# Patient Record
Sex: Male | Born: 1955 | Race: White | Hispanic: No | Marital: Married | Smoking: Former smoker
Health system: Southern US, Community
[De-identification: ages and names within clinical notes are randomized; demographics above are authoritative.]

## PROBLEM LIST (undated history)

## (undated) DIAGNOSIS — N200 Calculus of kidney: Secondary | ICD-10-CM

## (undated) DIAGNOSIS — F411 Generalized anxiety disorder: Secondary | ICD-10-CM

## (undated) DIAGNOSIS — E782 Mixed hyperlipidemia: Secondary | ICD-10-CM

## (undated) DIAGNOSIS — K5730 Diverticulosis of large intestine without perforation or abscess without bleeding: Secondary | ICD-10-CM

## (undated) DIAGNOSIS — N139 Obstructive and reflux uropathy, unspecified: Secondary | ICD-10-CM

## (undated) DIAGNOSIS — M5412 Radiculopathy, cervical region: Secondary | ICD-10-CM

## (undated) DIAGNOSIS — M5030 Other cervical disc degeneration, unspecified cervical region: Secondary | ICD-10-CM

## (undated) DIAGNOSIS — I10 Essential (primary) hypertension: Secondary | ICD-10-CM

## (undated) DIAGNOSIS — G4700 Insomnia, unspecified: Secondary | ICD-10-CM

## (undated) HISTORY — PX: HEMORROIDECTOMY: SUR656

## (undated) HISTORY — DX: Obstructive and reflux uropathy, unspecified: N13.9

## (undated) HISTORY — DX: Insomnia, unspecified: G47.00

## (undated) HISTORY — DX: Radiculopathy, cervical region: M54.12

## (undated) HISTORY — DX: Calculus of kidney: N20.0

## (undated) HISTORY — DX: Generalized anxiety disorder: F41.1

## (undated) HISTORY — DX: Mixed hyperlipidemia: E78.2

## (undated) HISTORY — PX: TONSILLECTOMY: SHX5217

## (undated) HISTORY — DX: Diverticulosis of large intestine without perforation or abscess without bleeding: K57.30

## (undated) HISTORY — DX: Essential (primary) hypertension: I10

## (undated) HISTORY — DX: Other cervical disc degeneration, unspecified cervical region: M50.30

---

## 2007-05-12 ENCOUNTER — Ambulatory Visit: Payer: Self-pay | Admitting: Emergency Medicine

## 2009-04-22 ENCOUNTER — Ambulatory Visit: Payer: Self-pay | Admitting: Family Medicine

## 2010-11-18 ENCOUNTER — Ambulatory Visit: Payer: Self-pay | Admitting: Internal Medicine

## 2011-09-17 ENCOUNTER — Emergency Department: Payer: Self-pay | Admitting: Unknown Physician Specialty

## 2011-12-17 ENCOUNTER — Ambulatory Visit: Payer: Self-pay | Admitting: Family Medicine

## 2011-12-22 ENCOUNTER — Ambulatory Visit: Payer: Self-pay | Admitting: Family Medicine

## 2011-12-22 DIAGNOSIS — R0602 Shortness of breath: Secondary | ICD-10-CM

## 2011-12-22 DIAGNOSIS — I517 Cardiomegaly: Secondary | ICD-10-CM

## 2012-03-21 ENCOUNTER — Ambulatory Visit: Payer: Self-pay | Admitting: Emergency Medicine

## 2012-05-08 ENCOUNTER — Ambulatory Visit: Payer: Self-pay | Admitting: Medical

## 2012-11-09 ENCOUNTER — Emergency Department: Payer: Self-pay | Admitting: Emergency Medicine

## 2012-11-09 ENCOUNTER — Telehealth: Payer: Self-pay

## 2012-11-09 LAB — CBC
HGB: 17.8 g/dL (ref 13.0–18.0)
MCV: 92 fL (ref 80–100)
Platelet: 256 10*3/uL (ref 150–440)
RDW: 14 % (ref 11.5–14.5)

## 2012-11-09 LAB — COMPREHENSIVE METABOLIC PANEL
Alkaline Phosphatase: 90 U/L (ref 50–136)
Anion Gap: 5 — ABNORMAL LOW (ref 7–16)
BUN: 18 mg/dL (ref 7–18)
Bilirubin,Total: 0.8 mg/dL (ref 0.2–1.0)
Co2: 30 mmol/L (ref 21–32)
Creatinine: 0.69 mg/dL (ref 0.60–1.30)
Osmolality: 283 (ref 275–301)
Potassium: 5.3 mmol/L — ABNORMAL HIGH (ref 3.5–5.1)
SGOT(AST): 30 U/L (ref 15–37)
SGPT (ALT): 41 U/L (ref 12–78)
Sodium: 141 mmol/L (ref 136–145)

## 2012-11-09 LAB — TROPONIN I: Troponin-I: 0.04 ng/mL

## 2012-11-09 NOTE — Telephone Encounter (Signed)
Dr. Mariah Milling spoke with PA in [facility] ED "schedule pt f/u from with Dr. Kirke Corin or myself for CP" VO Dr. Alvis Lemmings, RN

## 2012-11-22 ENCOUNTER — Telehealth: Payer: Self-pay

## 2012-11-22 NOTE — Telephone Encounter (Signed)
Lm for pt to call office but has not called as of today

## 2012-11-22 NOTE — Telephone Encounter (Signed)
Did we schedule pt?

## 2012-11-25 NOTE — Telephone Encounter (Signed)
Call pt number again to try and schedule and he says he doesn't want to schedule with [place].

## 2012-11-29 DIAGNOSIS — I10 Essential (primary) hypertension: Secondary | ICD-10-CM

## 2012-11-29 HISTORY — DX: Essential (primary) hypertension: I10

## 2012-11-30 DIAGNOSIS — F411 Generalized anxiety disorder: Secondary | ICD-10-CM

## 2012-11-30 DIAGNOSIS — E782 Mixed hyperlipidemia: Secondary | ICD-10-CM

## 2012-11-30 DIAGNOSIS — K644 Residual hemorrhoidal skin tags: Secondary | ICD-10-CM | POA: Insufficient documentation

## 2012-11-30 DIAGNOSIS — N139 Obstructive and reflux uropathy, unspecified: Secondary | ICD-10-CM

## 2012-11-30 DIAGNOSIS — K5730 Diverticulosis of large intestine without perforation or abscess without bleeding: Secondary | ICD-10-CM

## 2012-11-30 HISTORY — DX: Mixed hyperlipidemia: E78.2

## 2012-11-30 HISTORY — DX: Generalized anxiety disorder: F41.1

## 2012-11-30 HISTORY — DX: Diverticulosis of large intestine without perforation or abscess without bleeding: K57.30

## 2012-11-30 HISTORY — DX: Obstructive and reflux uropathy, unspecified: N13.9

## 2012-12-30 DIAGNOSIS — G4700 Insomnia, unspecified: Secondary | ICD-10-CM

## 2012-12-30 HISTORY — DX: Insomnia, unspecified: G47.00

## 2013-10-02 IMAGING — CT CT CERVICAL SPINE WITHOUT CONTRAST
1 series · 12 of 14 positions shown, 15 images · non-contrast
Comparison: None

REASON FOR EXAM: fall neck pain
COMMENTS:

PROCEDURE:     CT  - CT CERVICAL SPINE WO  - September 17, 2011  [DATE]
RESULT:     Clinical Indication: Trauma
TECHNIQUE: Multiple axial CT images from the skull base to the mid vertebral
body of T1. obtained with sagittal and coronal reformatted images provided.

[Series 4: axial · axial · 0.33mm/px · z∈[-241,-88]mm · 12 of 96 slices shown, 15 images]
[im 8/96  soft-tissue]
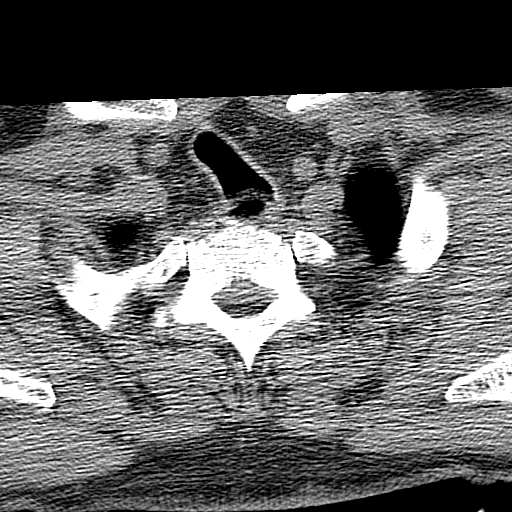
[im 8/96  bone]
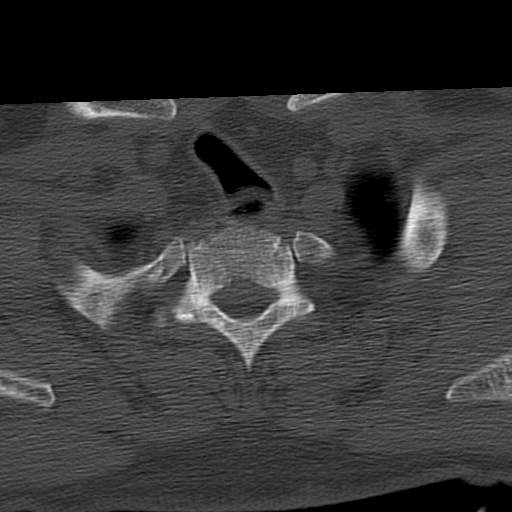
[im 15/96  bone]
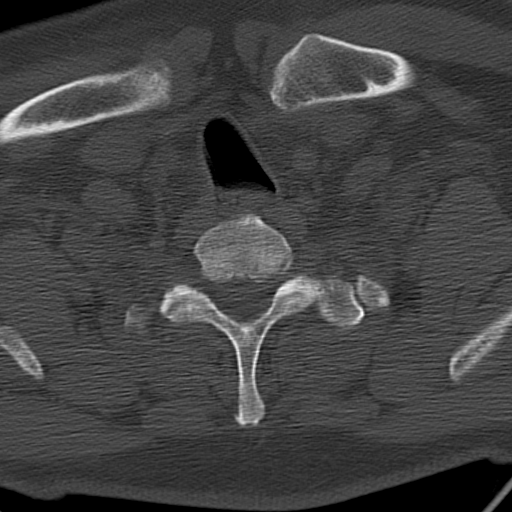
[im 22/96  bone]
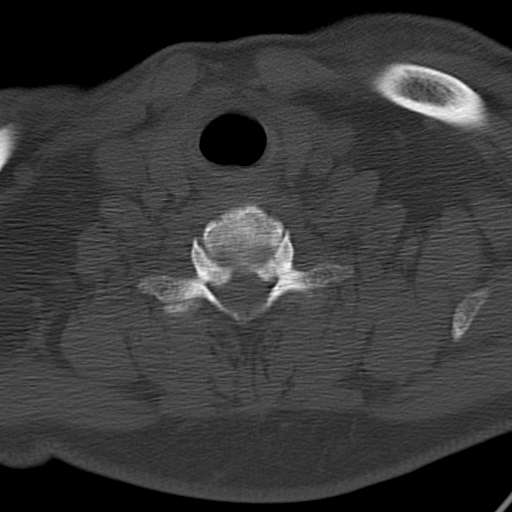
[im 30/96  bone]
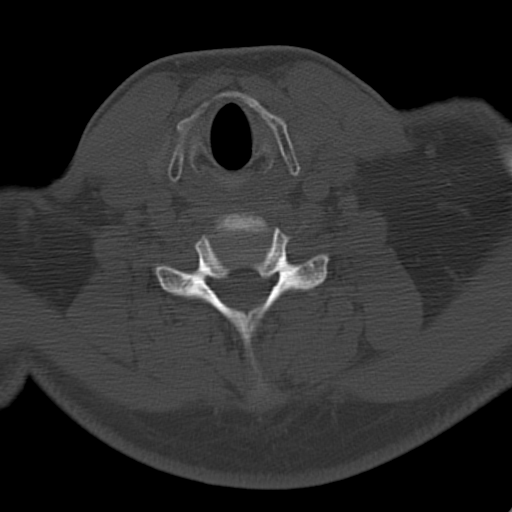
[im 37/96  soft-tissue]
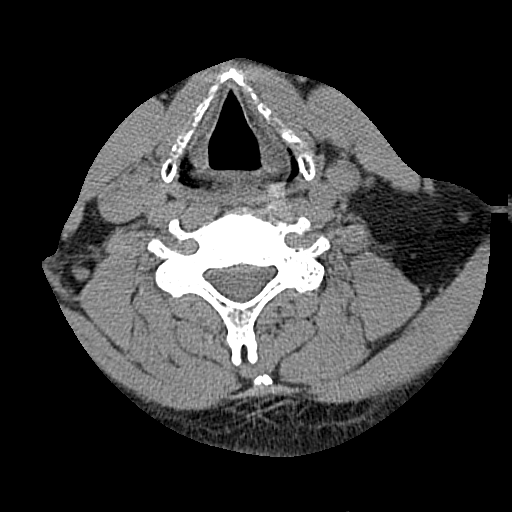
[im 37/96  bone]
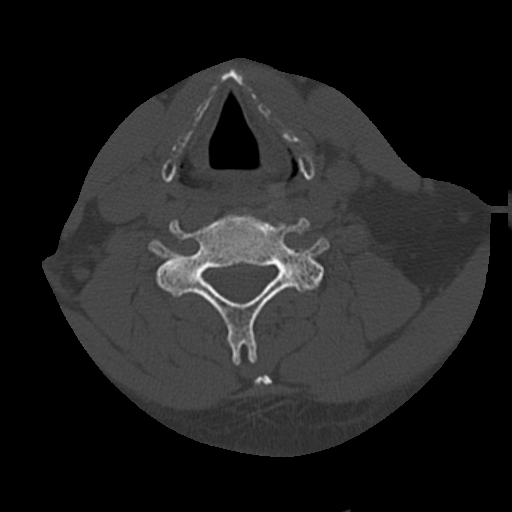
[im 44/96  bone]
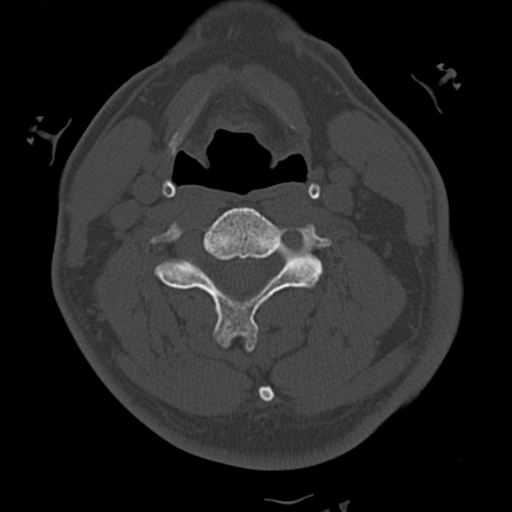
[im 52/96  bone]
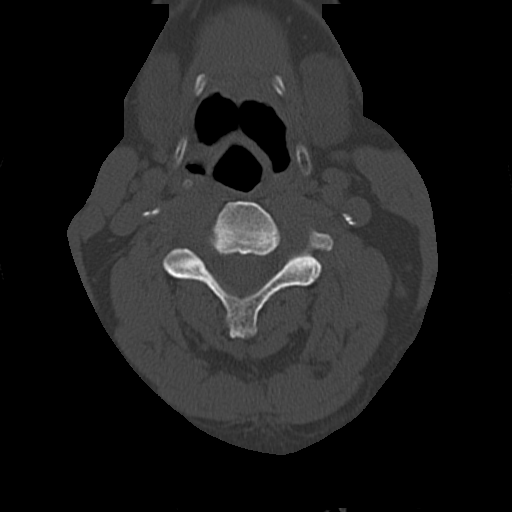
[im 59/96  bone]
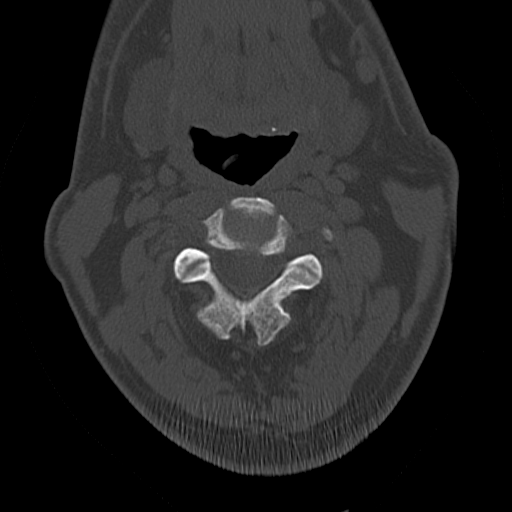
[im 66/96  soft-tissue]
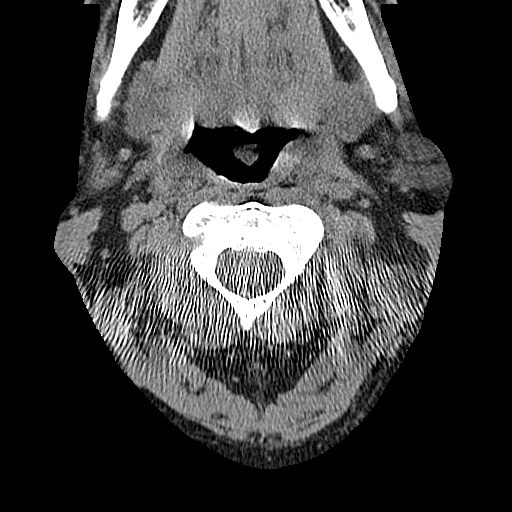
[im 66/96  bone]
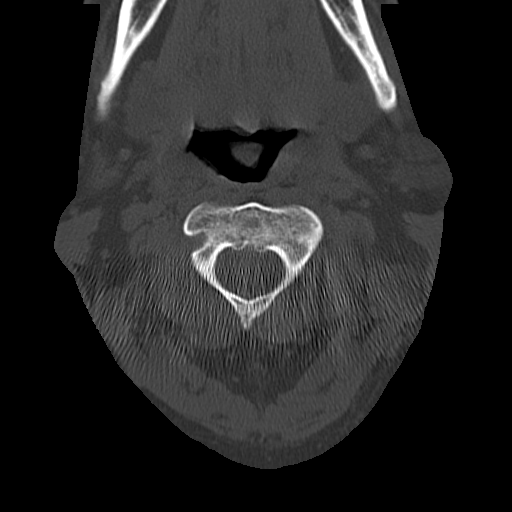
[im 74/96  bone]
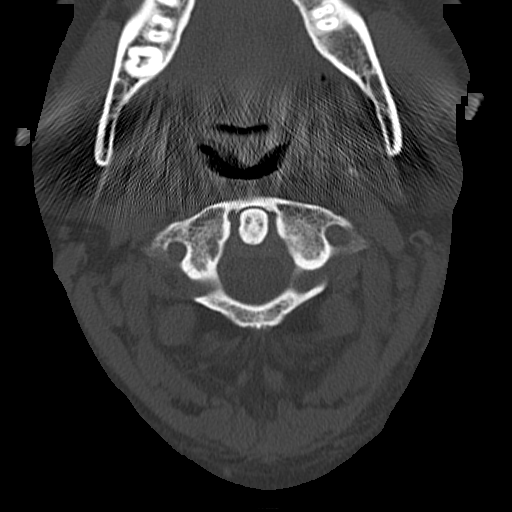
[im 81/96  bone]
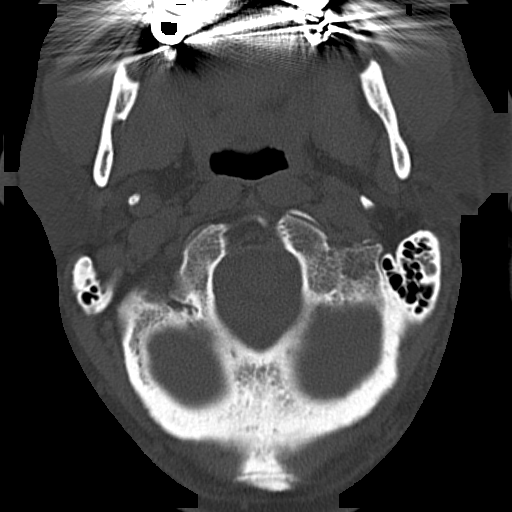
[im 88/96  bone]
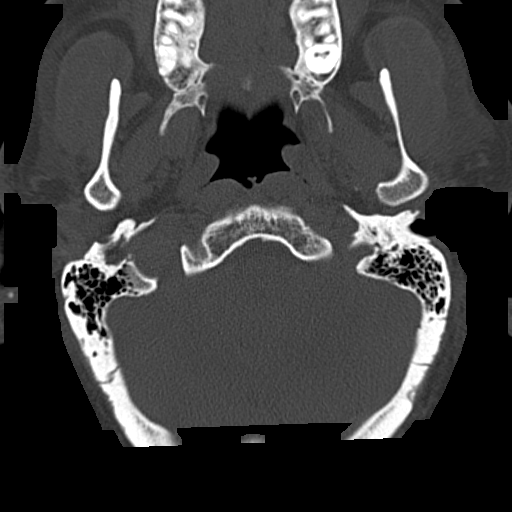

[12 of 14 positions shown; findings below may reference images not displayed]

FINDINGS: The alignment is anatomic. The vertebral body heights are maintained. There
is no acute fracture or static listhesis. The prevertebral soft tissues are
normal. The intraspinal soft tissues are not fully imaged on this
examination due to poor soft tissue contrast, but there is no soft tissue
gross abnormality.

There is degenerative disease at C6-C7.

The visualized portions of the lung apices demonstrate no focal abnormality.

There is bilateral carotid artery atherosclerosis.
IMPRESSION: 1. No acute osseous injury of the cervical spine.

2. Ligamentous injury is not evaluated. If there is high clinical concern
for ligamentous injury, consider MRI or flexion/extension radiographs as
clinically indicated and tolerated.

## 2014-01-01 IMAGING — US US CAROTID DUPLEX BILAT
1 series · 17 of 24 positions shown · non-contrast
Comparison: none

REASON FOR EXAM: High blood pressure hyperlipidemia arthrosclerosis
carotid artery seen on CT
COMMENTS:

[Series 1: us carotid duplex bilat · 17 of 69 slices shown]
[im 1/69]
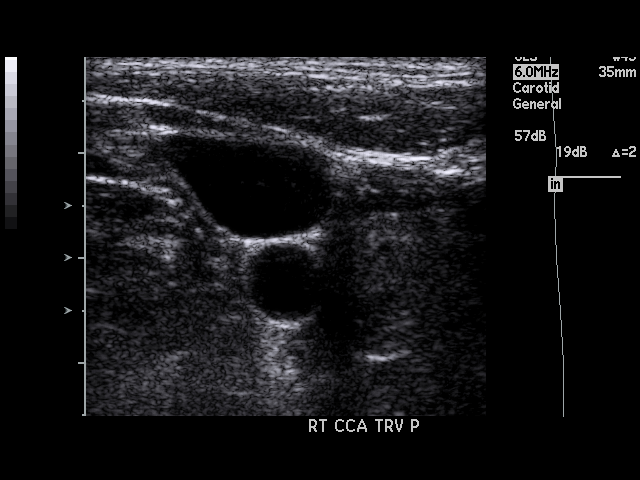
[im 6/69]
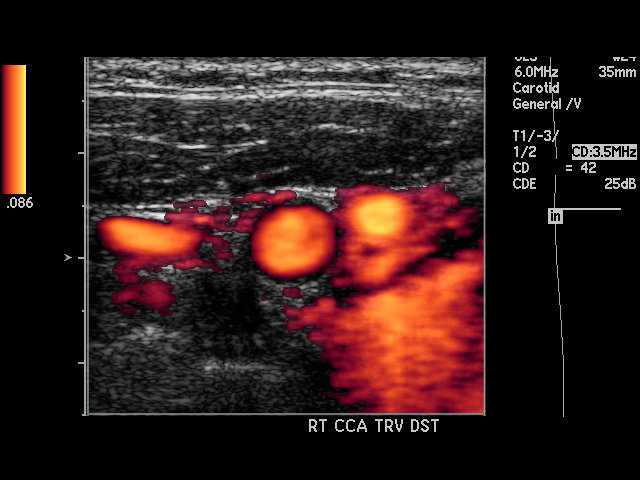
[im 9/69]
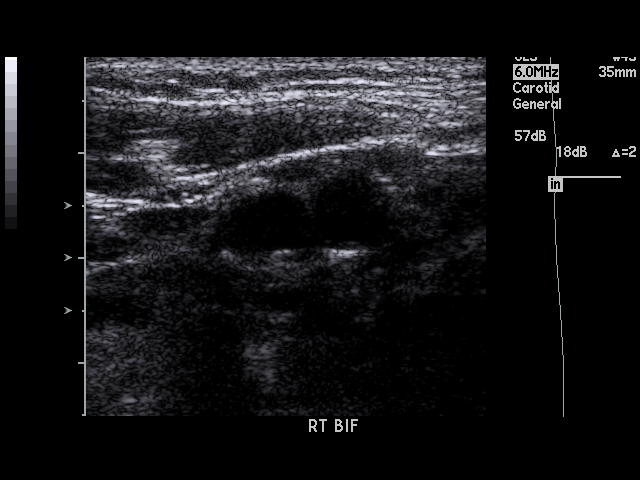
[im 12/69]
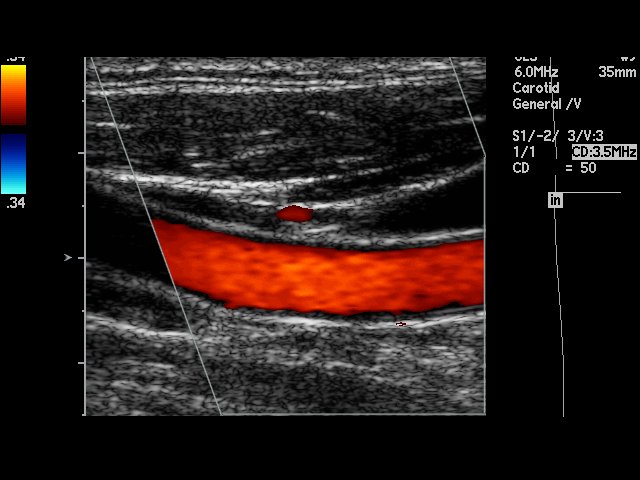
[im 18/69]
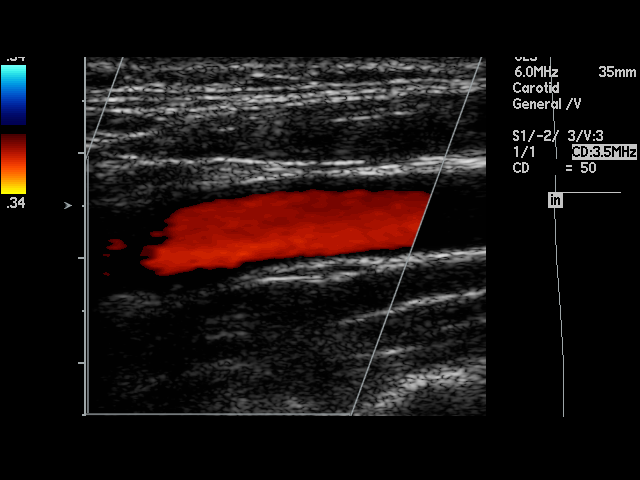
[im 21/69]
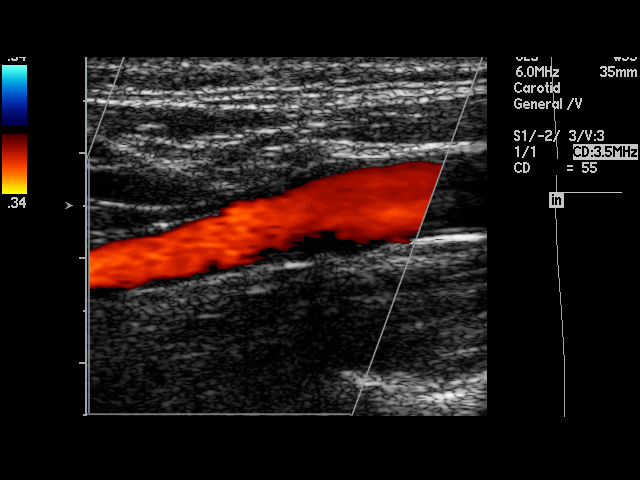
[im 27/69]
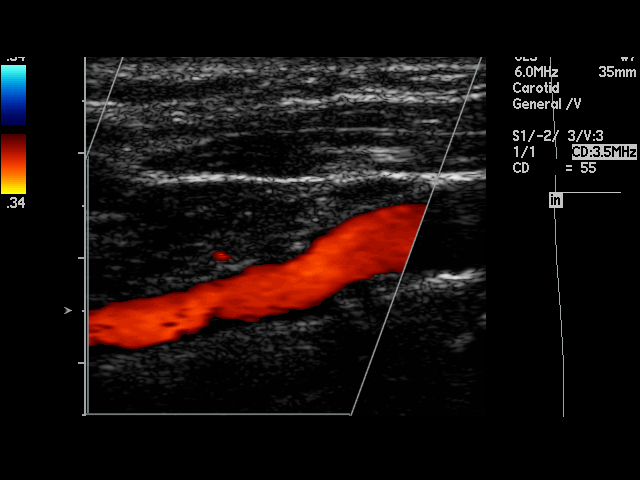
[im 30/69]
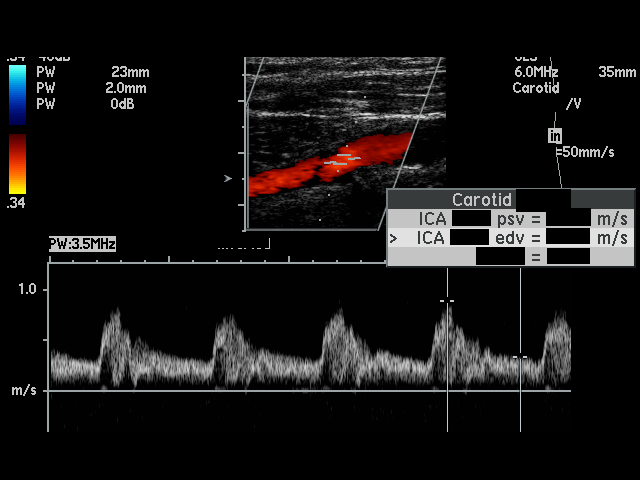
[im 36/69]
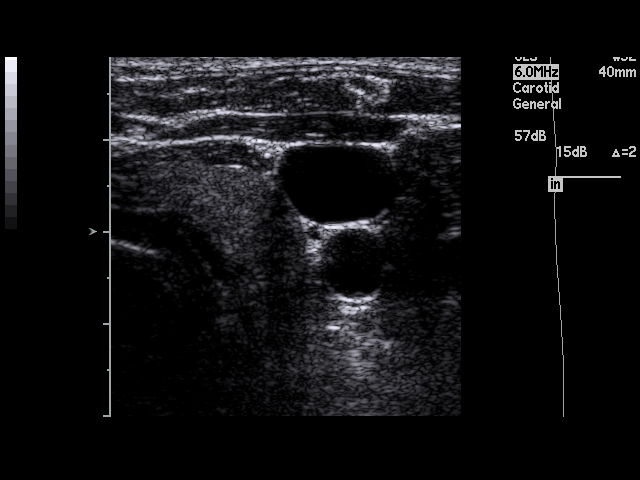
[im 39/69]
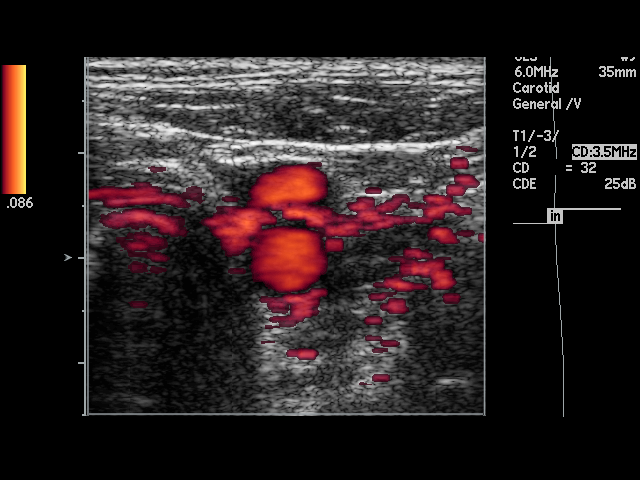
[im 42/69]
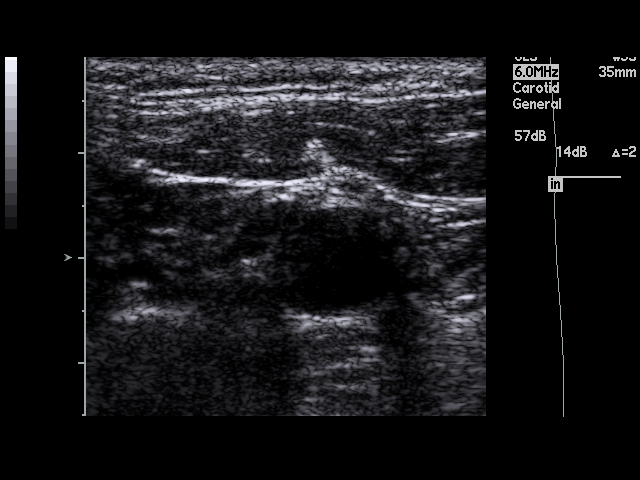
[im 48/69]
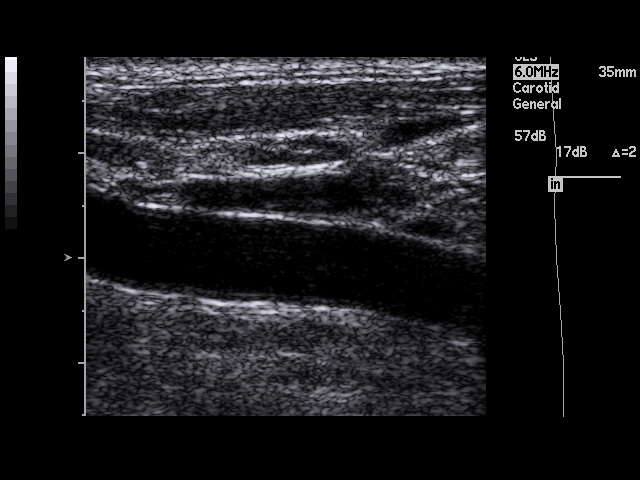
[im 51/69]
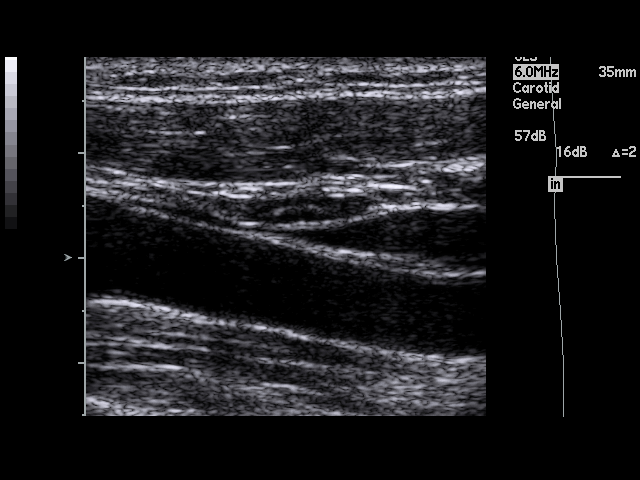
[im 57/69]
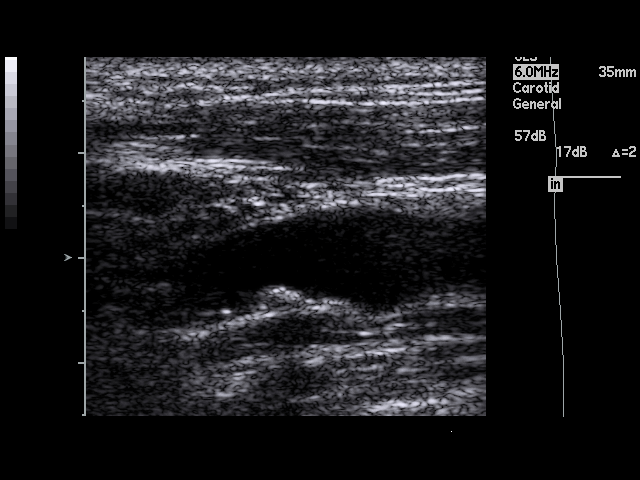
[im 60/69]
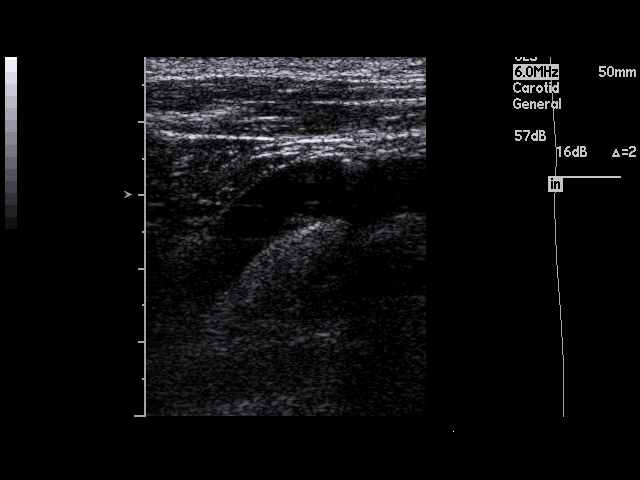
[im 63/69]
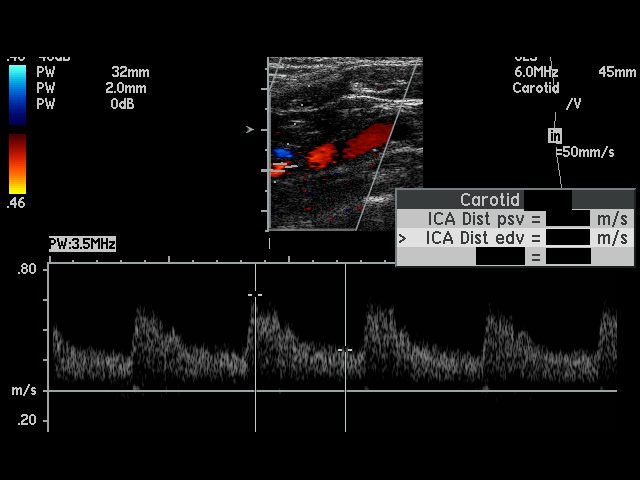
[im 69/69]
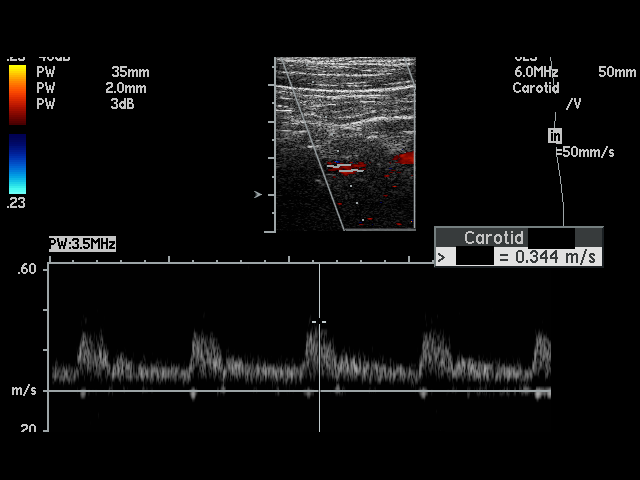

[17 of 24 positions shown; findings below may reference images not displayed]

PROCEDURE:     FINALHO - FINALHO CAROTID DOPPLER BILATERAL  - December 17, 2011 [DATE]

RESULT:     Carotid Doppler interrogation is performed. Antegrade flow is
seen in the carotid and vertebral systems bilaterally without visual
evidence of significant stenosis. There is some nonulcerated, smooth plaque
in the carotid bifurcation and internal carotid regions without abnormal
flow velocity. Antegrade flow is seen in both vertebral systems. The peak
systolic velocities are normal. The internal to common carotid peak systolic
velocity ratio is 1.10 on the right and 0.96 on the left.
IMPRESSION: 1.     Minimal atherosclerotic disease.
2.     No evidence of hemodynamically significant stenosis or flow reversal.

[REDACTED]

## 2014-06-13 DIAGNOSIS — M5030 Other cervical disc degeneration, unspecified cervical region: Secondary | ICD-10-CM

## 2014-06-13 DIAGNOSIS — M5412 Radiculopathy, cervical region: Secondary | ICD-10-CM | POA: Insufficient documentation

## 2014-06-13 HISTORY — DX: Radiculopathy, cervical region: M54.12

## 2014-06-13 HISTORY — DX: Other cervical disc degeneration, unspecified cervical region: M50.30

## 2016-06-19 ENCOUNTER — Other Ambulatory Visit: Payer: Self-pay

## 2016-06-19 ENCOUNTER — Ambulatory Visit (INDEPENDENT_AMBULATORY_CARE_PROVIDER_SITE_OTHER): Payer: BLUE CROSS/BLUE SHIELD | Admitting: Gastroenterology

## 2016-06-19 ENCOUNTER — Encounter: Payer: Self-pay | Admitting: Gastroenterology

## 2016-06-19 VITALS — BP 105/69 | HR 77 | Temp 98.2°F | Ht 68.0 in | Wt 203.0 lb

## 2016-06-19 DIAGNOSIS — K921 Melena: Secondary | ICD-10-CM

## 2016-06-19 DIAGNOSIS — R194 Change in bowel habit: Secondary | ICD-10-CM

## 2016-06-19 NOTE — Progress Notes (Signed)
Gastroenterology Consultation  Referring Provider:     No ref. provider found Primary Care Physician:  [facility] Madaline GuthrieALE E, MD Primary Gastroenterologist:  Dr. Servando SnareWohl     Reason for Consultation:     Hematochezia        HPI:   Jason Vazquez is a 60 y.o. y/o male referred for consultation & management of Hematochezia by Dr. Maryjane HurterFELDPAUSCH, Madaline GuthrieALE E, MD.  This patient comes today with a history of rectal bleeding. The patient also reports that his father had colon cancer. The patient has had 3 attempted colonoscopies and states that nobody has been able to transverse his colon with a colonoscope. The patient reports that his stools have become different in shape and smaller. He also reports that he has intermittent rectal bleeding despite having surgery for hemorrhoids in the past. There is no report of any unexplained weight loss, fevers, chills, nausea or vomiting. The patient also denies any black stools associated with his rectal bleeding. The patient states that her rectal bleeding is usually bright red blood. He also states that he has had 2 episodes of pancreatitis in the past but not in the last couple of years. Her being asked to see the patient because of his family history of colon cancer, change in bowel habits and inability to have a colonoscopy in the past.  Past Medical History:  Diagnosis Date  . Cervical radiculitis 06/13/2014  . DDD (degenerative disc disease), cervical 06/13/2014  . Diverticulosis of large intestine 11/30/2012  . Essential (primary) hypertension 11/29/2012   Overview:  Last Assessment & Plan:  His blood pressure is doing much better and it is adequately controlled.  He is not experiencing any side effects. For now continue metoprolol and losartan. I suggest that he continue to monitor his blood pressure home, but less frequently. Plan routine follow-up in 6 months.  . Generalized anxiety disorder 11/30/2012  . Insomnia 12/30/2012   Overview:  Last Assessment & Plan:  For  now continue clonazepam as needed 0.5 mg at bedtime. He may take an additional 0.5 mg during the day for acute anxiety  . Mixed hyperlipidemia 11/30/2012  . Obstructive and reflux uropathy 11/30/2012    Past Surgical History:  Procedure Laterality Date  . HEMORROIDECTOMY    . TONSILLECTOMY      Prior to Admission medications   Medication Sig Start Date End Date Taking? Authorizing Provider  B Complex Vitamins (VITAMIN-B COMPLEX) TABS Take by mouth.   Yes Historical Provider, MD  Cholecalciferol (VITAMIN D3) 2000 units capsule Take by mouth.   Yes Historical Provider, MD  clonazePAM (KLONOPIN) 0.5 MG tablet TAKE 1-3 TABS AT BEDTIME FOR INSOMNIA 06/27/13  Yes Historical Provider, MD  FLUoxetine (PROZAC) 10 MG capsule  06/17/16  Yes Historical Provider, MD  gabapentin (NEURONTIN) 300 MG capsule TAKE ONE CAPSULE BY MOUTH THREE TIMES DAILY 10/23/15  Yes Historical Provider, MD  losartan (COZAAR) 100 MG tablet  05/26/16  Yes Historical Provider, MD  metoprolol succinate (TOPROL-XL) 100 MG 24 hr tablet  05/23/16  Yes Historical Provider, MD  Multiple Vitamin (MULTIVITAMIN) capsule Take by mouth.   Yes Historical Provider, MD  Multiple Vitamins-Minerals (MULTIVITAMIN ADULT PO) Take by mouth.   Yes Historical Provider, MD  pravastatin (PRAVACHOL) 80 MG tablet  05/23/16  Yes Historical Provider, MD  tamsulosin (FLOMAX) 0.4 MG CAPS capsule  05/23/16  Yes Historical Provider, MD  aspirin EC 81 MG tablet Take 81 mg by mouth.    Historical Provider, MD  atenolol (  TENORMIN) 50 MG tablet Take 50 mg by mouth. 07/28/11   Historical Provider, MD  Cyanocobalamin (RA VITAMIN B-12 TR) 1000 MCG TBCR Take by mouth.    Historical Provider, MD  Glucosamine Sulfate 500 MG TABS Take by mouth.    Historical Provider, MD  Saw Palmetto 450 MG CAPS Take 900 mg by mouth.    Historical Provider, MD  TURMERIC PO Take by mouth.    Historical Provider, MD    Family History  Problem Relation Age of Onset  . COPD Mother   .  COPD Father      Social History  Substance Use Topics  . Smoking status: Never Smoker  . Smokeless tobacco: Never Used  . Alcohol use No    Allergies as of 06/19/2016  . (No Known Allergies)    Review of Systems:    All systems reviewed and negative except where noted in HPI.   Physical Exam:  BP 105/69   Pulse 77   Temp 98.2 F (36.8 C) (Oral)   Ht 5\' 8"  (1.727 m)   Wt 203 lb (92.1 kg)   BMI 30.87 kg/m  No LMP for male patient. Psych:  Alert and cooperative. Normal mood and affect. General:   Alert,  Well-developed, well-nourished, pleasant and cooperative in NAD Head:  Normocephalic and atraumatic. Eyes:  Sclera clear, no icterus.   Conjunctiva pink. Ears:  Normal auditory acuity. Nose:  No deformity, discharge, or lesions. Mouth:  No deformity or lesions,oropharynx pink & moist. Neck:  Supple; no masses or thyromegaly. Lungs:  Respirations even and unlabored.  Clear throughout to auscultation.   No wheezes, crackles, or rhonchi. No acute distress. Heart:  Regular rate and rhythm; no murmurs, clicks, rubs, or gallops. Abdomen:  Normal bowel sounds.  No bruits.  Soft, non-tender and non-distended without masses, hepatosplenomegaly or hernias noted.  No guarding or rebound tenderness.  Negative Carnett sign.   Rectal:  Deferred.  Msk:  Symmetrical without gross deformities.  Good, equal movement & strength bilaterally. Pulses:  Normal pulses noted. Extremities:  No clubbing or edema.  No cyanosis. Neurologic:  Alert and oriented x3;  grossly normal neurologically. Skin:  Intact without significant lesions or rashes.  No jaundice. Lymph Nodes:  No significant cervical adenopathy. Psych:  Alert and cooperative. Normal mood and affect.  Imaging Studies: No results found.  Assessment and Plan:   Jason Vazquez is a 60 y.o. y/o male who comes in today with a history of rectal bleeding and a change in bowel habits. The patient also has a family history of colon cancer in  his father. The patient will be set up for colonoscopy despite him having 3 failed attempts in the past. I have discussed risks & benefits which include, but are not limited to, bleeding, infection, perforation & drug reaction.  The patient agrees with this plan & written consent will be obtained.       Midge Miniumarren Lacrystal Barbe, MD. Clementeen GrahamFACG   Note: This dictation was prepared with Dragon dictation along with smaller phrase technology. Any transcriptional errors that result from this process are unintentional.

## 2016-06-20 ENCOUNTER — Other Ambulatory Visit: Payer: Self-pay

## 2016-06-25 ENCOUNTER — Telehealth: Payer: Self-pay

## 2016-06-25 NOTE — Telephone Encounter (Signed)
The pharmacy sent over a prior auth form. Patient is wanting to know if Ginger filled it out.   I told him that I can call him in something else. Patient then asked why we can't just fill out the auth for and send it back.   Patient would like Ginger to personally call him back with this information

## 2016-06-26 NOTE — Telephone Encounter (Signed)
Pt advised insurance normally will not cover medication even with a precert unless he is allergic to the contents of the generic bowel preps. Advised pt I will give him a sample of the suprep if he will come and pick it up prior to his procedure.

## 2016-06-27 ENCOUNTER — Encounter: Payer: Self-pay | Admitting: *Deleted

## 2016-06-30 ENCOUNTER — Encounter: Payer: Self-pay | Admitting: Anesthesiology

## 2016-07-01 ENCOUNTER — Telehealth: Payer: Self-pay | Admitting: Gastroenterology

## 2016-07-01 NOTE — Telephone Encounter (Signed)
Patient called to cancel his colonoscopy due to him having to pay 3000.00 out of pocket.  He did state that he was having trouble when he went to Dr. Maryjane HurterFeldpausch and I explained that's why it wasn't a screening and not free.  He said he would go somewhere that he didn't have to pay anything and to go ahead and cancel his appointment. [facility]

## 2016-07-07 ENCOUNTER — Ambulatory Visit
Admission: RE | Admit: 2016-07-07 | Payer: BLUE CROSS/BLUE SHIELD | Source: Ambulatory Visit | Admitting: Gastroenterology

## 2016-07-07 SURGERY — COLONOSCOPY WITH PROPOFOL
Anesthesia: Choice

## 2017-04-11 HISTORY — PX: EXTERNAL FIXATION PELVIS: SHX1551

## 2017-04-11 HISTORY — PX: ORIF PELVIC FRACTURE: SHX2128

## 2017-04-14 DIAGNOSIS — Z933 Colostomy status: Secondary | ICD-10-CM | POA: Insufficient documentation

## 2017-04-14 DIAGNOSIS — S82301B Unspecified fracture of lower end of right tibia, initial encounter for open fracture type I or II: Secondary | ICD-10-CM | POA: Insufficient documentation

## 2017-04-14 DIAGNOSIS — S8251XA Displaced fracture of medial malleolus of right tibia, initial encounter for closed fracture: Secondary | ICD-10-CM | POA: Insufficient documentation

## 2017-04-14 DIAGNOSIS — S329XXA Fracture of unspecified parts of lumbosacral spine and pelvis, initial encounter for closed fracture: Secondary | ICD-10-CM | POA: Insufficient documentation

## 2017-04-14 DIAGNOSIS — S82401A Unspecified fracture of shaft of right fibula, initial encounter for closed fracture: Secondary | ICD-10-CM | POA: Insufficient documentation

## 2017-04-15 HISTORY — PX: EXPLORATORY LAPAROTOMY: SUR591

## 2017-04-15 HISTORY — PX: EXTERNAL FIXATION ANKLE FRACTURE: SHX1548

## 2017-04-15 HISTORY — PX: CLOSED REDUCTION TIBIAL FRACTURE: SHX1361

## 2017-04-16 HISTORY — PX: CYSTORRHAPHY: SUR367

## 2017-04-24 DIAGNOSIS — S069XAA Unspecified intracranial injury with loss of consciousness status unknown, initial encounter: Secondary | ICD-10-CM | POA: Insufficient documentation

## 2017-04-24 DIAGNOSIS — S069X9A Unspecified intracranial injury with loss of consciousness of unspecified duration, initial encounter: Secondary | ICD-10-CM | POA: Insufficient documentation

## 2017-04-27 HISTORY — PX: DEBRIDEMENT LEG: SUR390

## 2017-05-12 HISTORY — PX: ORIF TIBIA FRACTURE: SHX5416

## 2017-05-24 HISTORY — PX: JEJUNOSTOMY: SHX313

## 2017-05-27 HISTORY — PX: LAPAROSCOPIC SMALL BOWEL RESECTION: SUR793

## 2017-07-10 HISTORY — PX: ANKLE ARTHRODESIS: SUR49

## 2017-07-31 HISTORY — PX: SKIN GRAFT SPLIT THICKNESS TRUNK: SUR1304

## 2017-09-17 ENCOUNTER — Ambulatory Visit: Payer: BLUE CROSS/BLUE SHIELD | Admitting: Family Medicine

## 2017-10-01 ENCOUNTER — Other Ambulatory Visit: Payer: Self-pay

## 2017-10-01 ENCOUNTER — Ambulatory Visit
Admission: EM | Admit: 2017-10-01 | Discharge: 2017-10-01 | Disposition: A | Discharge status: Home / Self Care | Payer: BLUE CROSS/BLUE SHIELD | Attending: Emergency Medicine | Admitting: Emergency Medicine

## 2017-10-01 DIAGNOSIS — N390 Urinary tract infection, site not specified: Secondary | ICD-10-CM

## 2017-10-01 LAB — URINALYSIS, COMPLETE (UACMP) WITH MICROSCOPIC
BILIRUBIN URINE: NEGATIVE
Glucose, UA: NEGATIVE mg/dL
Ketones, ur: NEGATIVE mg/dL
Nitrite: POSITIVE — AB
PH: 6.5 (ref 5.0–8.0)
Protein, ur: 100 mg/dL — AB
SPECIFIC GRAVITY, URINE: 1.02 (ref 1.005–1.030)
Squamous Epithelial / LPF: NONE SEEN

## 2017-10-01 MED ORDER — CEPHALEXIN 250 MG PO CAPS: 250.0000 mg | ORAL_CAPSULE | Freq: Four times a day (QID) | ORAL | 0 refills | Status: DC

## 2017-10-01 NOTE — ED Triage Notes (Signed)
Patient complains of urinary frequency x 11 days. Patient states that he went to see his primary doctor and was placed on Bactrim for 3 days on 2/15. Patient states that symptoms did not improve. Patient is still feeling like bladder does not completely empty out.

## 2017-10-01 NOTE — Discharge Instructions (Signed)
-  Keflex: one tablet four times a day for 14 days -push fluids -will send urine for culture. -follow up with PCP and surgeon as needed
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
## 2017-10-02 ENCOUNTER — Encounter: Payer: Self-pay | Admitting: Internal Medicine

## 2017-10-03 ENCOUNTER — Encounter: Payer: Self-pay | Admitting: Internal Medicine

## 2017-10-03 ENCOUNTER — Telehealth (HOSPITAL_COMMUNITY): Payer: Self-pay | Admitting: Internal Medicine

## 2017-10-03 ENCOUNTER — Other Ambulatory Visit: Payer: Self-pay | Admitting: Internal Medicine

## 2017-10-03 DIAGNOSIS — I5181 Takotsubo syndrome: Secondary | ICD-10-CM | POA: Insufficient documentation

## 2017-10-03 LAB — URINE CULTURE: Culture: >=100000 — AB

## 2017-10-03 MED ORDER — NITROFURANTOIN MONOHYD MACRO 100 MG PO CAPS: 100.0000 mg | ORAL_CAPSULE | Freq: Two times a day (BID) | ORAL | 0 refills | Status: AC

## 2017-10-03 NOTE — Telephone Encounter (Signed)
Clinical staff, please let patient know that urine culture was positive for E coli germ, resistant to cephalexin prescription given at the urgent care visit but sensitive to nitrofurantoin. Stop taking cephalexin.  Rx nitrofurantoin sent to the pharmacy of record,  [facility] on [address]  Recheck or followup with PCP or with infectious disease specialist, for guidance on management if symptoms are not improving.  LM

## 2017-10-05 ENCOUNTER — Encounter: Payer: Self-pay | Admitting: Internal Medicine

## 2017-10-05 ENCOUNTER — Ambulatory Visit: Payer: BLUE CROSS/BLUE SHIELD | Admitting: Internal Medicine

## 2017-10-05 VITALS — BP 122/80 | HR 98 | Ht 68.0 in | Wt 180.0 lb

## 2017-10-05 DIAGNOSIS — S82421D Displaced transverse fracture of shaft of right fibula, subsequent encounter for closed fracture with routine healing: Secondary | ICD-10-CM

## 2017-10-05 DIAGNOSIS — I10 Essential (primary) hypertension: Secondary | ICD-10-CM | POA: Diagnosis not present

## 2017-10-05 DIAGNOSIS — F5101 Primary insomnia: Secondary | ICD-10-CM

## 2017-10-05 DIAGNOSIS — I5181 Takotsubo syndrome: Secondary | ICD-10-CM | POA: Diagnosis not present

## 2017-10-05 DIAGNOSIS — R739 Hyperglycemia, unspecified: Secondary | ICD-10-CM | POA: Diagnosis not present

## 2017-10-05 DIAGNOSIS — Z945 Skin transplant status: Secondary | ICD-10-CM

## 2017-10-05 DIAGNOSIS — K632 Fistula of intestine: Secondary | ICD-10-CM

## 2017-10-05 DIAGNOSIS — Z933 Colostomy status: Secondary | ICD-10-CM

## 2017-10-05 DIAGNOSIS — S329XXA Fracture of unspecified parts of lumbosacral spine and pelvis, initial encounter for closed fracture: Secondary | ICD-10-CM

## 2017-10-05 DIAGNOSIS — S069X5S Unspecified intracranial injury with loss of consciousness greater than 24 hours with return to pre-existing conscious level, sequela: Secondary | ICD-10-CM

## 2017-10-05 MED ORDER — CLONAZEPAM 0.5 MG PO TABS: 0.5000 mg | ORAL_TABLET | Freq: Every day | ORAL | 3 refills | Status: DC
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
## 2017-10-06 LAB — BASIC METABOLIC PANEL
BUN / CREAT RATIO: 15 (ref 10–24)
BUN: 11 mg/dL (ref 8–27)
CALCIUM: 9.6 mg/dL (ref 8.6–10.2)
CO2: 29 mmol/L (ref 20–29)
CREATININE: 0.72 mg/dL — AB (ref 0.76–1.27)
Chloride: 98 mmol/L (ref 96–106)
GFR calc non Af Amer: 101 mL/min/{1.73_m2} (ref >59)
GFR, EST AFRICAN AMERICAN: 117 mL/min/{1.73_m2} (ref >59)
Glucose: 91 mg/dL (ref 65–99)
Potassium: 4.9 mmol/L (ref 3.5–5.2)
Sodium: 143 mmol/L (ref 134–144)

## 2017-10-06 LAB — HEMOGLOBIN A1C
Est. average glucose Bld gHb Est-mCnc: 117 mg/dL
HEMOGLOBIN A1C: 5.7 % — AB (ref 4.8–5.6)

## 2017-10-23 ENCOUNTER — Other Ambulatory Visit: Payer: Self-pay

## 2017-10-23 ENCOUNTER — Encounter: Payer: Self-pay | Admitting: Internal Medicine

## 2017-10-27 ENCOUNTER — Encounter: Payer: Self-pay | Admitting: Internal Medicine

## 2017-10-27 NOTE — Telephone Encounter (Signed)
Patient requesting referral for ostomies and fistula.

## 2017-10-28 ENCOUNTER — Other Ambulatory Visit: Payer: Self-pay

## 2017-10-28 ENCOUNTER — Other Ambulatory Visit: Payer: Self-pay | Admitting: Internal Medicine

## 2017-10-28 ENCOUNTER — Emergency Department: Payer: BLUE CROSS/BLUE SHIELD

## 2017-10-28 ENCOUNTER — Emergency Department
Admission: EM | Admit: 2017-10-28 | Discharge: 2017-10-28 | Disposition: A | Discharge status: Home / Self Care | Payer: BLUE CROSS/BLUE SHIELD | Attending: Student in an Organized Health Care Education/Training Program | Admitting: Student in an Organized Health Care Education/Training Program

## 2017-10-28 ENCOUNTER — Telehealth: Payer: Self-pay

## 2017-10-28 DIAGNOSIS — Z7901 Long term (current) use of anticoagulants: Secondary | ICD-10-CM | POA: Insufficient documentation

## 2017-10-28 DIAGNOSIS — F411 Generalized anxiety disorder: Secondary | ICD-10-CM | POA: Diagnosis not present

## 2017-10-28 DIAGNOSIS — I10 Essential (primary) hypertension: Secondary | ICD-10-CM | POA: Insufficient documentation

## 2017-10-28 DIAGNOSIS — M7989 Other specified soft tissue disorders: Secondary | ICD-10-CM

## 2017-10-28 DIAGNOSIS — Z79899 Other long term (current) drug therapy: Secondary | ICD-10-CM | POA: Insufficient documentation

## 2017-10-28 DIAGNOSIS — Z87891 Personal history of nicotine dependence: Secondary | ICD-10-CM | POA: Insufficient documentation

## 2017-10-28 DIAGNOSIS — Z933 Colostomy status: Secondary | ICD-10-CM

## 2017-10-28 DIAGNOSIS — R2241 Localized swelling, mass and lump, right lower limb: Secondary | ICD-10-CM | POA: Diagnosis not present

## 2017-10-28 NOTE — Telephone Encounter (Signed)
If his leg is swollen, he needs to go to the ER and get an US to rule out a blood clot. I have not done a urine culture since February - what does she mean "he keeps getting Ecoli in his urine"?  Has he been tested somewhere else since then? The lower ostomy should be slowing down on output.  Most of the output should be from the upper one.
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
## 2017-10-28 NOTE — Telephone Encounter (Signed)
Sue LushAndrea RN from [facility] called saying she seen Patient this morning. His regular nurse is out sick so this is her first time meeting the patient but she has some concerns. She knows they are waiting to hear from [facility] since we just placed that referral today. His Rt lower extremity is very swollen. Measured at 14cm. More swollen than has been. He has two ostomy patches. The lower stoma is putting out some liquid and its no longer doing that. He keeps having UTI's and cultures keep showing E Coli. Hx of fistula's and leaks in Abdomen. Worried about where E Coli is coming from. Wanting us to be aware. He has a appt coming up this Friday with our office.

## 2017-10-28 NOTE — Telephone Encounter (Signed)
Tried calling patient phone and it went to his VM- left VM to inform to go to ER ASAP to be evaluated for a blood clot since his leg is that swollen. Asked him to call me so I know he got my message. Awaiting that call.

## 2017-10-29 ENCOUNTER — Ambulatory Visit: Payer: BLUE CROSS/BLUE SHIELD | Admitting: Internal Medicine

## 2017-10-30 ENCOUNTER — Other Ambulatory Visit: Payer: Self-pay | Admitting: Internal Medicine

## 2017-10-30 ENCOUNTER — Ambulatory Visit: Payer: BLUE CROSS/BLUE SHIELD | Admitting: Internal Medicine

## 2017-10-30 ENCOUNTER — Encounter: Payer: Self-pay | Admitting: Internal Medicine

## 2017-10-30 VITALS — BP 102/78 | HR 99 | Ht 68.0 in | Wt 179.0 lb

## 2017-10-30 DIAGNOSIS — N342 Other urethritis: Secondary | ICD-10-CM

## 2017-10-30 DIAGNOSIS — S82421D Displaced transverse fracture of shaft of right fibula, subsequent encounter for closed fracture with routine healing: Secondary | ICD-10-CM

## 2017-10-30 DIAGNOSIS — K632 Fistula of intestine: Secondary | ICD-10-CM | POA: Diagnosis not present

## 2017-10-30 DIAGNOSIS — S329XXA Fracture of unspecified parts of lumbosacral spine and pelvis, initial encounter for closed fracture: Secondary | ICD-10-CM

## 2017-10-30 DIAGNOSIS — I10 Essential (primary) hypertension: Secondary | ICD-10-CM

## 2017-10-30 DIAGNOSIS — R300 Dysuria: Secondary | ICD-10-CM | POA: Diagnosis not present

## 2017-10-30 LAB — POC URINALYSIS WITH MICROSCOPIC (NON AUTO)MANUAL RESULT
EPITHELIAL CELLS, URINE PER MICROSCOPY: 0
GLUCOSE UA: NEGATIVE
KETONES UA: NEGATIVE
Mucus, UA: 0
NITRITE UA: NEGATIVE
PH UA: 6 (ref 5.0–8.0)
RBC UA: NEGATIVE
RBC: 0 M/uL — AB (ref 4.69–6.13)
Spec Grav, UA: 1.025 (ref 1.010–1.025)
UROBILINOGEN UA: 0.2 U/dL
WBC CASTS UA: 5

## 2017-10-30 MED ORDER — DOXYCYCLINE HYCLATE 100 MG PO TABS: 100.0000 mg | ORAL_TABLET | Freq: Two times a day (BID) | ORAL | 0 refills | Status: AC
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
## 2017-11-02 ENCOUNTER — Other Ambulatory Visit: Payer: Self-pay

## 2017-11-02 MED ORDER — METOPROLOL TARTRATE 25 MG PO TABS: 25.0000 mg | ORAL_TABLET | Freq: Two times a day (BID) | ORAL | 1 refills | Status: DC

## 2017-11-02 MED ORDER — APIXABAN 2.5 MG PO TABS: 2.5000 mg | ORAL_TABLET | Freq: Two times a day (BID) | ORAL | 1 refills | Status: DC

## 2017-11-03 ENCOUNTER — Ambulatory Visit: Payer: BLUE CROSS/BLUE SHIELD

## 2017-11-03 LAB — URINE CULTURE

## 2017-11-05 ENCOUNTER — Other Ambulatory Visit: Payer: Self-pay | Admitting: Internal Medicine

## 2017-11-05 ENCOUNTER — Telehealth: Payer: Self-pay | Admitting: Internal Medicine

## 2017-11-05 DIAGNOSIS — N3000 Acute cystitis without hematuria: Secondary | ICD-10-CM

## 2017-11-05 MED ORDER — AMOXICILLIN-POT CLAVULANATE 875-125 MG PO TABS: 1.0000 | ORAL_TABLET | Freq: Two times a day (BID) | ORAL | 0 refills | Status: AC

## 2017-11-05 NOTE — Telephone Encounter (Signed)
Please let patient know that the urine culture came back positive.  However, it appears that the bacteria should be susceptible to the Doxycycline.  If he is still having urinary sx, I will send in a different antibiotic.

## 2017-11-05 NOTE — Telephone Encounter (Signed)
[facility] [place].

## 2017-11-05 NOTE — Telephone Encounter (Signed)
Patient informed. Stated still not feeling better. Wants to try diff antibiotic. Informed will be sending in to pharmacy and to begin that tonight. He verbalized understanding.

## 2017-11-13 ENCOUNTER — Encounter: Payer: Self-pay | Admitting: Oncology

## 2017-11-13 ENCOUNTER — Inpatient Hospital Stay: Payer: BLUE CROSS/BLUE SHIELD

## 2017-11-13 ENCOUNTER — Other Ambulatory Visit: Payer: Self-pay

## 2017-11-13 ENCOUNTER — Inpatient Hospital Stay: Payer: BLUE CROSS/BLUE SHIELD | Attending: Oncology | Admitting: Oncology

## 2017-11-13 VITALS — BP 104/65 | HR 98 | Temp 97.1°F | Resp 18 | Ht 67.72 in | Wt 184.0 lb

## 2017-11-13 DIAGNOSIS — I10 Essential (primary) hypertension: Secondary | ICD-10-CM | POA: Insufficient documentation

## 2017-11-13 DIAGNOSIS — Z7901 Long term (current) use of anticoagulants: Secondary | ICD-10-CM

## 2017-11-13 DIAGNOSIS — Z87891 Personal history of nicotine dependence: Secondary | ICD-10-CM | POA: Insufficient documentation

## 2017-11-13 DIAGNOSIS — I2699 Other pulmonary embolism without acute cor pulmonale: Secondary | ICD-10-CM | POA: Insufficient documentation

## 2017-11-13 DIAGNOSIS — F419 Anxiety disorder, unspecified: Secondary | ICD-10-CM | POA: Diagnosis not present

## 2017-11-13 DIAGNOSIS — Z86711 Personal history of pulmonary embolism: Secondary | ICD-10-CM | POA: Insufficient documentation

## 2017-11-13 DIAGNOSIS — Z79899 Other long term (current) drug therapy: Secondary | ICD-10-CM | POA: Diagnosis not present

## 2017-11-13 LAB — COMPREHENSIVE METABOLIC PANEL
ALK PHOS: 102 U/L (ref 38–126)
ALT: 17 U/L (ref 17–63)
AST: 22 U/L (ref 15–41)
Albumin: 3.4 g/dL — ABNORMAL LOW (ref 3.5–5.0)
Anion gap: 12 (ref 5–15)
BUN: 15 mg/dL (ref 6–20)
CALCIUM: 8.1 mg/dL — AB (ref 8.9–10.3)
CO2: 30 mmol/L (ref 22–32)
CREATININE: 0.74 mg/dL (ref 0.61–1.24)
Chloride: 100 mmol/L — ABNORMAL LOW (ref 101–111)
Glucose, Bld: 104 mg/dL — ABNORMAL HIGH (ref 65–99)
Potassium: 4.3 mmol/L (ref 3.5–5.1)
Sodium: 142 mmol/L (ref 135–145)
Total Bilirubin: 1.3 mg/dL — ABNORMAL HIGH (ref 0.3–1.2)
Total Protein: 6.9 g/dL (ref 6.5–8.1)

## 2017-11-13 LAB — CBC WITH DIFFERENTIAL/PLATELET
BASOS PCT: 1 %
Basophils Absolute: 0 10*3/uL (ref 0–0.1)
EOS ABS: 0.1 10*3/uL (ref 0–0.7)
Eosinophils Relative: 3 %
HCT: 38.3 % — ABNORMAL LOW (ref 40.0–52.0)
HEMOGLOBIN: 12.6 g/dL — AB (ref 13.0–18.0)
Lymphocytes Relative: 35 %
Lymphs Abs: 1.5 10*3/uL (ref 1.0–3.6)
MCH: 26.5 pg (ref 26.0–34.0)
MCHC: 32.9 g/dL (ref 32.0–36.0)
MCV: 80.7 fL (ref 80.0–100.0)
Monocytes Absolute: 0.5 10*3/uL (ref 0.2–1.0)
Monocytes Relative: 12 %
NEUTROS PCT: 49 %
Neutro Abs: 2.2 10*3/uL (ref 1.4–6.5)
Platelets: 247 10*3/uL (ref 150–440)
RBC: 4.74 MIL/uL (ref 4.40–5.90)
RDW: 22.8 % — ABNORMAL HIGH (ref 11.5–14.5)
WBC: 4.5 10*3/uL (ref 3.8–10.6)

## 2017-11-13 NOTE — Progress Notes (Signed)
Here for new pt evaluation.  
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
## 2017-11-17 ENCOUNTER — Encounter: Payer: Self-pay | Admitting: Cardiovascular Disease

## 2017-11-17 ENCOUNTER — Ambulatory Visit: Payer: BLUE CROSS/BLUE SHIELD | Admitting: Cardiovascular Disease

## 2017-11-17 VITALS — BP 124/80 | HR 80 | Ht 67.0 in | Wt 179.5 lb

## 2017-11-17 DIAGNOSIS — I25118 Atherosclerotic heart disease of native coronary artery with other forms of angina pectoris: Secondary | ICD-10-CM | POA: Diagnosis not present

## 2017-11-17 DIAGNOSIS — I10 Essential (primary) hypertension: Secondary | ICD-10-CM | POA: Diagnosis not present

## 2017-11-17 DIAGNOSIS — I420 Dilated cardiomyopathy: Secondary | ICD-10-CM

## 2017-11-17 DIAGNOSIS — S329XXA Fracture of unspecified parts of lumbosacral spine and pelvis, initial encounter for closed fracture: Secondary | ICD-10-CM | POA: Diagnosis not present

## 2017-11-17 DIAGNOSIS — E782 Mixed hyperlipidemia: Secondary | ICD-10-CM

## 2017-11-17 NOTE — Patient Instructions (Addendum)
  [facility] Cardiac Cath Instructions   You are scheduled for a Cardiac Cath on:_Friday April 12th__  Please arrive at _06:30_am on the day of your procedure  Please expect a call from our [facility] to pre-register you  Do not eat/drink anything after midnight  Someone will need to drive you home  It is recommended someone be with you for the first 24 hours after your procedure  Wear clothes that are easy to get on/off and wear slip on shoes if possible   Medications bring a current list of all medications with you  _X__ You may take all of your medications the morning of your procedure with enough water to swallow safely  _X__ HOLD: ELIQUIS 2 days prior to the procedure. STOP tomorrow     Day of your procedure: Arrive at the [facility] entrance.  Free valet service is available.  After entering the [facility] please check-in at the registration desk (1st desk on your right) to receive your armband. After receiving your armband someone will escort you to the cardiac cath/special procedures waiting area.  The usual length of stay after your procedure is about 2 to 3 hours.  This can vary.  If you have any questions, please call our office at [phone], or you may call the cardiac cath lab at [facility] directly at [phone]

## 2017-11-18 ENCOUNTER — Encounter: Payer: Self-pay | Admitting: Internal Medicine

## 2017-11-18 LAB — BASIC METABOLIC PANEL
BUN / CREAT RATIO: 22 (ref 10–24)
BUN: 13 mg/dL (ref 8–27)
CHLORIDE: 101 mmol/L (ref 96–106)
CO2: 28 mmol/L (ref 20–29)
CREATININE: 0.58 mg/dL — AB (ref 0.76–1.27)
Calcium: 8.7 mg/dL (ref 8.6–10.2)
GFR calc Af Amer: 128 mL/min/{1.73_m2} (ref >59)
GFR calc non Af Amer: 111 mL/min/{1.73_m2} (ref >59)
GLUCOSE: 96 mg/dL (ref 65–99)
POTASSIUM: 4.6 mmol/L (ref 3.5–5.2)
SODIUM: 145 mmol/L — AB (ref 134–144)

## 2017-11-18 LAB — CBC WITH DIFFERENTIAL/PLATELET
Basophils Absolute: 0 10*3/uL (ref 0.0–0.2)
Basos: 1 %
EOS (ABSOLUTE): 0.2 10*3/uL (ref 0.0–0.4)
Eos: 3 %
Hematocrit: 40.8 % (ref 37.5–51.0)
Hemoglobin: 13.1 g/dL (ref 13.0–17.7)
Immature Grans (Abs): 0 10*3/uL (ref 0.0–0.1)
Immature Granulocytes: 0 %
LYMPHS ABS: 1.8 10*3/uL (ref 0.7–3.1)
Lymphs: 31 %
MCH: 26.1 pg — AB (ref 26.6–33.0)
MCHC: 32.1 g/dL (ref 31.5–35.7)
MCV: 81 fL (ref 79–97)
MONOS ABS: 0.4 10*3/uL (ref 0.1–0.9)
Monocytes: 7 %
NEUTROS ABS: 3.3 10*3/uL (ref 1.4–7.0)
Neutrophils: 58 %
PLATELETS: 261 10*3/uL (ref 150–379)
RBC: 5.02 x10E6/uL (ref 4.14–5.80)
RDW: 21.4 % — AB (ref 12.3–15.4)
WBC: 5.7 10*3/uL (ref 3.4–10.8)

## 2017-11-18 LAB — PROTIME-INR
INR: 1.1 (ref 0.8–1.2)
Prothrombin Time: 11.3 s (ref 9.1–12.0)

## 2017-11-19 ENCOUNTER — Telehealth: Payer: Self-pay | Admitting: Cardiovascular Disease

## 2017-11-19 ENCOUNTER — Other Ambulatory Visit: Payer: Self-pay | Admitting: Cardiovascular Disease

## 2017-11-19 ENCOUNTER — Other Ambulatory Visit: Payer: Self-pay

## 2017-11-19 ENCOUNTER — Telehealth: Payer: Self-pay | Admitting: *Deleted

## 2017-11-19 DIAGNOSIS — I420 Dilated cardiomyopathy: Secondary | ICD-10-CM

## 2017-11-19 NOTE — Telephone Encounter (Signed)
Call returned to patient and I read him Dr Bethanne GingerYu's note. He thanked me for calling

## 2017-11-19 NOTE — Telephone Encounter (Signed)
I called and spoke with the patient to remind him of his heart cath that is scheduled for tomorrow at 7:30 am.  He is aware to: - arrive at 6:30 am at the [facility] - NPO after midnight - all meds are ok with enough water to get them down safely (he is currently holding eliquis) - someone will need to drive him home & be with him for the first 24 hours - wear comfortable clothes & slip on shoes if possible  The patient verbalized understanding of all of the above.

## 2017-11-19 NOTE — Telephone Encounter (Signed)
Called patient to remind about heart cath tomorrow. He had already received call from nurse earlier today and had no further questions.

## 2017-11-19 NOTE — Telephone Encounter (Signed)
Patient called stating Dr Cathie HoopsYu was to call him yesterday with a decision on his medicine. Please return his call [phone]

## 2017-11-19 NOTE — Telephone Encounter (Signed)
Try to call him again, did not answer.  Please let him know that after reviewing his records at [facility], it appears that he had a very small PE (considered to be provoked) and was anticoagulated for more than 3 months.  At discharge, he was sent on Eliquis 2.5mg  BID for prophylaxis.  it is ok to temporarily discontinue Eliquis 2-3 days prior to colonoscopy and resume after procedure.  Regarding to perioperative period for colostomy reversal, ok to discontinue 2-3 days prior, and resume Eliquis 2.5mg  BID postoperatively, assuming achieving hemostasis.  Please see my note. Thanks.

## 2017-11-19 NOTE — Telephone Encounter (Signed)
Patient requesting RX refill. Please Advise.

## 2017-11-20 ENCOUNTER — Encounter
Admission: RE | Disposition: A | Discharge status: Home / Self Care | Payer: Self-pay | Source: Ambulatory Visit | Attending: Cardiovascular Disease

## 2017-11-20 ENCOUNTER — Encounter: Payer: Self-pay | Admitting: *Deleted

## 2017-11-20 ENCOUNTER — Ambulatory Visit
Admission: RE | Admit: 2017-11-20 | Discharge: 2017-11-20 | Disposition: A | Discharge status: Home / Self Care | Payer: BLUE CROSS/BLUE SHIELD | Source: Ambulatory Visit | Attending: Cardiovascular Disease | Admitting: Cardiovascular Disease

## 2017-11-20 DIAGNOSIS — R931 Abnormal findings on diagnostic imaging of heart and coronary circulation: Secondary | ICD-10-CM

## 2017-11-20 DIAGNOSIS — I420 Dilated cardiomyopathy: Secondary | ICD-10-CM | POA: Diagnosis present

## 2017-11-20 DIAGNOSIS — I10 Essential (primary) hypertension: Secondary | ICD-10-CM | POA: Diagnosis not present

## 2017-11-20 DIAGNOSIS — I2582 Chronic total occlusion of coronary artery: Secondary | ICD-10-CM | POA: Insufficient documentation

## 2017-11-20 DIAGNOSIS — G4700 Insomnia, unspecified: Secondary | ICD-10-CM | POA: Insufficient documentation

## 2017-11-20 DIAGNOSIS — I255 Ischemic cardiomyopathy: Secondary | ICD-10-CM | POA: Diagnosis not present

## 2017-11-20 DIAGNOSIS — E782 Mixed hyperlipidemia: Secondary | ICD-10-CM | POA: Diagnosis not present

## 2017-11-20 DIAGNOSIS — Z87891 Personal history of nicotine dependence: Secondary | ICD-10-CM | POA: Insufficient documentation

## 2017-11-20 DIAGNOSIS — F411 Generalized anxiety disorder: Secondary | ICD-10-CM | POA: Diagnosis not present

## 2017-11-20 DIAGNOSIS — I429 Cardiomyopathy, unspecified: Secondary | ICD-10-CM

## 2017-11-20 DIAGNOSIS — Z7901 Long term (current) use of anticoagulants: Secondary | ICD-10-CM | POA: Insufficient documentation

## 2017-11-20 DIAGNOSIS — I25118 Atherosclerotic heart disease of native coronary artery with other forms of angina pectoris: Secondary | ICD-10-CM | POA: Diagnosis present

## 2017-11-20 DIAGNOSIS — I2510 Atherosclerotic heart disease of native coronary artery without angina pectoris: Secondary | ICD-10-CM | POA: Diagnosis not present

## 2017-11-20 HISTORY — PX: LEFT HEART CATH AND CORONARY ANGIOGRAPHY: CATH118249

## 2017-11-20 SURGERY — LEFT HEART CATH AND CORONARY ANGIOGRAPHY
Anesthesia: Moderate Sedation | Laterality: Bilateral

## 2017-11-20 SURGERY — LEFT HEART CATH AND CORONARY ANGIOGRAPHY
Anesthesia: Moderate Sedation | Laterality: Left

## 2017-11-20 MED ORDER — ASPIRIN 81 MG PO CHEW: 81.0000 mg | CHEWABLE_TABLET | ORAL | Status: DC

## 2017-11-20 MED ORDER — SODIUM CHLORIDE 0.9 % WEIGHT BASED INFUSION
3.0000 mL/kg/h | INTRAVENOUS | Status: AC
  Administered 2017-11-20: 3 mL/kg/h via INTRAVENOUS

## 2017-11-20 MED ORDER — IOPAMIDOL (ISOVUE-300) INJECTION 61%
INTRAVENOUS | Status: DC | PRN
  Administered 2017-11-20: 140 mL via INTRA_ARTERIAL

## 2017-11-20 MED ORDER — FENTANYL CITRATE (PF) 100 MCG/2ML IJ SOLN
INTRAMUSCULAR | Status: DC | PRN
  Administered 2017-11-20: 37.5 ug via INTRAVENOUS

## 2017-11-20 MED ORDER — HEPARIN (PORCINE) IN NACL 2-0.9 UNIT/ML-% IJ SOLN
INTRAMUSCULAR | Status: AC
  Filled 2017-11-20: qty 1000

## 2017-11-20 MED ORDER — FENTANYL CITRATE (PF) 100 MCG/2ML IJ SOLN
INTRAMUSCULAR | Status: AC
  Filled 2017-11-20: qty 2

## 2017-11-20 MED ORDER — MIDAZOLAM HCL 2 MG/2ML IJ SOLN
INTRAMUSCULAR | Status: AC
  Filled 2017-11-20: qty 2

## 2017-11-20 MED ORDER — MIDAZOLAM HCL 2 MG/2ML IJ SOLN
INTRAMUSCULAR | Status: DC | PRN
  Administered 2017-11-20: 1.5 mg via INTRAVENOUS

## 2017-11-20 MED ORDER — SODIUM CHLORIDE 0.9 % WEIGHT BASED INFUSION: 1.0000 mL/kg/h | INTRAVENOUS | Status: DC

## 2017-11-20 SURGICAL SUPPLY — 10 items
CATH INFINITI 5 FR 3DRC (CATHETERS) ×2 IMPLANT
CATH INFINITI 5FR ANG PIGTAIL (CATHETERS) ×2 IMPLANT
CATH INFINITI 5FR JL4 (CATHETERS) ×2 IMPLANT
CATH INFINITI JR4 5F (CATHETERS) ×2 IMPLANT
DEVICE CLOSURE MYNXGRIP 5F (Vascular Products) ×2 IMPLANT
KIT MANI 3VAL PERCEP (MISCELLANEOUS) ×2 IMPLANT
NEEDLE PERC 18GX7CM (NEEDLE) ×2 IMPLANT
PACK CARDIAC CATH (CUSTOM PROCEDURE TRAY) ×2 IMPLANT
SHEATH AVANTI 5FR X 11CM (SHEATH) ×2 IMPLANT
WIRE GUIDERIGHT .035X150 (WIRE) ×2 IMPLANT

## 2017-11-20 NOTE — H&P (Signed)
H&P Addendum, precardiac catheterization  Patient was seen and evaluated prior to Cardiac catheterization procedure Symptoms, prior testing details again confirmed with the patient Patient examined, no significant change from prior exam Lab work reviewed in detail personally by myself Patient understands risk and benefit of the procedure, willing to proceed  Signed, Tim Satoshi Kalas, MD, Ph.D [facility]    

## 2017-11-23 ENCOUNTER — Telehealth: Payer: Self-pay | Admitting: Cardiovascular Disease

## 2017-11-23 DIAGNOSIS — Z79899 Other long term (current) drug therapy: Secondary | ICD-10-CM

## 2017-11-23 DIAGNOSIS — Z13220 Encounter for screening for lipoid disorders: Secondary | ICD-10-CM

## 2017-11-23 DIAGNOSIS — E782 Mixed hyperlipidemia: Secondary | ICD-10-CM

## 2017-11-23 NOTE — Telephone Encounter (Signed)
Pt calling stating he had an angiogram last week   He states Dr Mariah MillingGollan mentioned him needing to have labs drawn  He is calling to see about getting these orders placed  Please advise

## 2017-11-23 NOTE — Telephone Encounter (Signed)
We can place order for liver and lipids, fasting thx TG

## 2017-11-23 NOTE — Telephone Encounter (Signed)
Called patient.  Patient said Dr Mariah MillingGollan mentioned that he would need to have his cholesterol checked. Advised I will route to Dr Mariah MillingGollan to see what exactly he wants ordered and be in touch with him.  Patient has appointment on 12/07/17 with Dr Mariah MillingGollan.

## 2017-11-24 NOTE — Telephone Encounter (Signed)
Called patient. He verbalized understanding to go to the [facility] at his earliest convenience for lab work and to be fasting. He was very Adult nurseappreciative. Lab orders entered.

## 2017-11-26 ENCOUNTER — Other Ambulatory Visit
Admission: RE | Admit: 2017-11-26 | Discharge: 2017-11-26 | Disposition: A | Discharge status: Home / Self Care | Payer: BLUE CROSS/BLUE SHIELD | Source: Ambulatory Visit | Attending: Cardiovascular Disease | Admitting: Cardiovascular Disease

## 2017-11-26 DIAGNOSIS — Z79899 Other long term (current) drug therapy: Secondary | ICD-10-CM | POA: Insufficient documentation

## 2017-11-26 DIAGNOSIS — Z13220 Encounter for screening for lipoid disorders: Secondary | ICD-10-CM | POA: Insufficient documentation

## 2017-11-26 DIAGNOSIS — E782 Mixed hyperlipidemia: Secondary | ICD-10-CM

## 2017-11-26 LAB — LIPID PANEL
CHOL/HDL RATIO: 2.5 ratio
CHOLESTEROL: 130 mg/dL (ref 0–200)
HDL: 53 mg/dL (ref >40)
LDL Cholesterol: 45 mg/dL (ref 0–99)
TRIGLYCERIDES: 161 mg/dL — AB (ref <150)
VLDL: 32 mg/dL (ref 0–40)

## 2017-11-26 LAB — HEPATIC FUNCTION PANEL
ALK PHOS: 110 U/L (ref 38–126)
ALT: 20 U/L (ref 17–63)
AST: 24 U/L (ref 15–41)
Albumin: 3.6 g/dL (ref 3.5–5.0)
BILIRUBIN DIRECT: 0.2 mg/dL (ref 0.1–0.5)
BILIRUBIN TOTAL: 1.7 mg/dL — AB (ref 0.3–1.2)
Indirect Bilirubin: 1.5 mg/dL — ABNORMAL HIGH (ref 0.3–0.9)
Total Protein: 7.1 g/dL (ref 6.5–8.1)

## 2017-11-30 ENCOUNTER — Other Ambulatory Visit: Payer: Self-pay

## 2017-11-30 MED ORDER — TAMSULOSIN HCL 0.4 MG PO CAPS: 0.4000 mg | ORAL_CAPSULE | Freq: Every day | ORAL | 3 refills | Status: DC
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
## 2017-12-07 ENCOUNTER — Ambulatory Visit: Payer: BLUE CROSS/BLUE SHIELD | Admitting: Cardiovascular Disease

## 2017-12-07 ENCOUNTER — Encounter: Payer: Self-pay | Admitting: Cardiovascular Disease

## 2017-12-07 VITALS — BP 120/70 | HR 65 | Ht 67.0 in | Wt 179.0 lb

## 2017-12-07 DIAGNOSIS — I420 Dilated cardiomyopathy: Secondary | ICD-10-CM

## 2017-12-07 DIAGNOSIS — I25118 Atherosclerotic heart disease of native coronary artery with other forms of angina pectoris: Secondary | ICD-10-CM

## 2017-12-07 DIAGNOSIS — I10 Essential (primary) hypertension: Secondary | ICD-10-CM

## 2017-12-07 DIAGNOSIS — E782 Mixed hyperlipidemia: Secondary | ICD-10-CM | POA: Diagnosis not present

## 2017-12-07 MED ORDER — CARVEDILOL 3.125 MG PO TABS: 3.1250 mg | ORAL_TABLET | Freq: Two times a day (BID) | ORAL | 3 refills | Status: DC

## 2017-12-07 MED ORDER — SACUBITRIL-VALSARTAN 24-26 MG PO TABS: 1.0000 | ORAL_TABLET | Freq: Two times a day (BID) | ORAL | 6 refills | Status: DC

## 2017-12-07 NOTE — Patient Instructions (Addendum)
Medication Instructions:  Your physician has recommended you make the following change in your medication:  1. STOP Metoprolol 2. START Carvedilol 3.125 mg twice a day 3. (After a few weeks) START Entresto 24/26 mg twice a day  Medication Samples have been provided to the patient.  Drug name: Sherryll Burger       Strength: 24/26 mg         Qty: 1 box  LOT: [id number]  Exp.Date: Jun 2021   Labwork:  In one month please go to [facility] and check in at the front desk to have your labs done. We will call you with those results.   Testing/Procedures:  No further testing at this time   Follow-Up: It was a pleasure seeing you in the office today. Please call us if you have new issues that need to be addressed before your next appt.  [phone]  Your physician wants you to follow-up in: 6 months.  You will receive a reminder letter in the mail two months in advance. If you don't receive a letter, please call our office to schedule the follow-up appointment.  If you need a refill on your cardiac medications before your next appointment, please call your pharmacy.  For educational health videos Log in to : [email or web address] Or : [email or web address], password : [email or web address]

## 2017-12-08 ENCOUNTER — Telehealth: Payer: Self-pay | Admitting: Cardiovascular Disease

## 2017-12-08 NOTE — Telephone Encounter (Signed)
Left voicemail message to call back  

## 2017-12-08 NOTE — Telephone Encounter (Signed)
Spoke with patient and advised that we would process the prior authorization and then they would be able to fill his medication. Instructed him to please call back if he should have any problems or concerns. He was appreciative for the call with no further questions at this time.

## 2017-12-08 NOTE — Telephone Encounter (Signed)
Pt calling stating the Nelva Nay we prescribed his [employer] won't cover it   He is calling for we may need to do a PA on this  Please advise

## 2017-12-09 ENCOUNTER — Telehealth: Payer: Self-pay | Admitting: Cardiovascular Disease

## 2017-12-09 NOTE — Telephone Encounter (Signed)
Prior authorization submitted through [email or web address] for Entresto 24-26 mg [id number] (Key: [id number]).

## 2017-12-11 ENCOUNTER — Telehealth: Payer: Self-pay | Admitting: Cardiovascular Disease

## 2017-12-11 NOTE — Telephone Encounter (Signed)
Pt states he is having side effects from Carvedilol. States he has a dry cough, SOB, and mild chest pain. Please call. States he did have a cough prior. Denies any sob, or CP as of now.

## 2017-12-11 NOTE — Telephone Encounter (Signed)
Spoke with patient who recently started Carvedilol (4/29) and now is experiencing a dry cough, intermittent  (comes with anxiety) SOB  and mild CP.  The dry cough seems to be persistent.  Please, advise, thank you.

## 2017-12-13 NOTE — Telephone Encounter (Signed)
Lets change coreg to metoprolol succinate 25 mg daily

## 2017-12-14 MED ORDER — METOPROLOL TARTRATE 25 MG PO TABS: 25.0000 mg | ORAL_TABLET | Freq: Two times a day (BID) | ORAL | 3 refills | Status: AC

## 2017-12-14 NOTE — Telephone Encounter (Signed)
Prior authorization appeal submitted and they are requesting fax with patients signature on appeal form. Spoke with patient and requested that he come by to sign when possible. He was appreciative for the call with no further questions.

## 2017-12-14 NOTE — Telephone Encounter (Signed)
Spoke with patient and reviewed recommendations by Dr. Mariah Milling. He reports that the metoprolol succinate does not work for him due to short gut syndrome. He reports that he went back to his metoprolol tartrate because he had some left over. Discussed medications with him and also we are pending appeal for his Entresto. He was appreciative for the call with no further questions at this time.   Alerted Dr. Mariah Milling to patients inability to take long acting due to short gut syndrome and that he resumed previous metoprolol dosage with medications updated.

## 2017-12-15 ENCOUNTER — Telehealth: Payer: Self-pay | Admitting: *Deleted

## 2017-12-15 NOTE — Telephone Encounter (Signed)
-----   Message from Rickard Patience, MD sent at 12/10/2017  8:37 AM EDT ----- Regarding: RE: canceled appt Contact: [phone] My recommendation is follow up with me every 6 months.   ----- Message ----- From: Lauretta Grill Sent: 12/08/2017   9:09 AM To: Marchia Meiers, CMA, Rickard Patience, MD Subject: canceled appt                                  Pt called in to cancel Fall appt-states he doesn't need the appt?-thx!

## 2017-12-15 NOTE — Telephone Encounter (Signed)
Spoke with patient.  Clearified Dr Bethanne Ginger recommendations. Patient states that he does not want to come back in 6 mths.  Patient states that he is pleased with his results and will call to schedule an appt with [place] if feels he needs to.

## 2017-12-16 NOTE — Telephone Encounter (Signed)
Patient would like to speak with Pam regarding the information you have been discussing  Please call

## 2017-12-16 NOTE — Telephone Encounter (Signed)
Patient states that he has a Agricultural consultant which told him it would have been approved if we had answered 2 questions differently He requested that I please call her to see if we could get another referral placed. He provided me with her name and contact info. I will reach out to her and see if we can get it processed and advised that I would let him know of the outcome. He was appreciative for the call back with no further questions at this time.   [employer] Marilu Favre at [phone]

## 2017-12-17 ENCOUNTER — Encounter: Payer: Self-pay | Admitting: Cardiovascular Disease

## 2017-12-22 NOTE — Telephone Encounter (Signed)
Patient calling to check on status of prior auth °

## 2017-12-22 NOTE — Telephone Encounter (Signed)
Prior authorization submitted through covermymeds with updated information for Entresto. Pending review from insurance company and will monitor for any updates or requests.

## 2017-12-23 ENCOUNTER — Telehealth: Payer: Self-pay

## 2017-12-23 ENCOUNTER — Other Ambulatory Visit: Payer: Self-pay | Admitting: Internal Medicine

## 2017-12-23 NOTE — Telephone Encounter (Signed)
Spoke with patient to update that we are still working on his prior authorization for the [place]. He was very upset that this is causing all of this trouble and mentioned possibly going to another provider. Expressed my apologies for the delay in getting this taken care of and that I would call him back with updates.

## 2017-12-23 NOTE — Telephone Encounter (Signed)
I have never prescribed it for him and I don't know what it is for.

## 2017-12-23 NOTE — Telephone Encounter (Signed)
Wants Gabapentin filled

## 2017-12-23 NOTE — Telephone Encounter (Signed)
Left voicemail message for Shanda Bumps to call back to assist with filling form out.

## 2017-12-23 NOTE — Telephone Encounter (Signed)
Spoke with Shanda Bumps at [place] and she was able to transfer me to appeals department. They will fax me appeals form to be completed and I will then return.

## 2017-12-24 ENCOUNTER — Encounter: Payer: Self-pay | Admitting: Internal Medicine

## 2017-12-24 NOTE — Telephone Encounter (Signed)
Patient requesting refill on medication that we have not prescribed in past. It looks to have not been filled since 10/23/2015.  Please Advise.

## 2017-12-25 NOTE — Telephone Encounter (Signed)
FYI: Back pain is why he takes Gabapentin but he will get from Orth[facility] Tuesday.

## 2017-12-25 NOTE — Telephone Encounter (Signed)
Left voicemail message to speak with patients case manager regarding status of submitted appeal.

## 2017-12-25 NOTE — Telephone Encounter (Signed)
Will have [facility] to fill after for back pain on this Tuesday.Marland Kitchen

## 2017-12-29 NOTE — Telephone Encounter (Signed)
Spoke with Shanda Bumps from [place] and will email her the form which was faxed to [facility] 12/24/17. She will review this form with their supervisor and then let me know which forms need to be completed.

## 2018-01-04 ENCOUNTER — Encounter: Payer: Self-pay | Admitting: Cardiovascular Disease

## 2018-01-05 ENCOUNTER — Ambulatory Visit: Payer: BLUE CROSS/BLUE SHIELD | Admitting: Internal Medicine

## 2018-01-06 ENCOUNTER — Ambulatory Visit: Payer: BLUE CROSS/BLUE SHIELD | Admitting: Family Medicine

## 2018-01-12 NOTE — Telephone Encounter (Signed)
Spoke with Shanda BumpsJessica at [employer] and she states that the appeal was marked invalid request due to not having the member authorization form in addition to the appeal form. Will check with our pharmacy team on this for further assistance.

## 2018-01-15 NOTE — Telephone Encounter (Signed)
Faxed all forms with signed form by patient and provider for appeal.

## 2018-01-18 ENCOUNTER — Encounter: Payer: Self-pay | Admitting: Cardiovascular Disease

## 2018-01-19 ENCOUNTER — Other Ambulatory Visit: Payer: Self-pay | Admitting: *Deleted

## 2018-01-19 DIAGNOSIS — I25118 Atherosclerotic heart disease of native coronary artery with other forms of angina pectoris: Secondary | ICD-10-CM

## 2018-01-22 ENCOUNTER — Telehealth: Payer: Self-pay

## 2018-01-22 ENCOUNTER — Other Ambulatory Visit
Admission: RE | Admit: 2018-01-22 | Discharge: 2018-01-22 | Disposition: A | Discharge status: Home / Self Care | Payer: BLUE CROSS/BLUE SHIELD | Source: Ambulatory Visit | Attending: Cardiovascular Disease | Admitting: Cardiovascular Disease

## 2018-01-22 DIAGNOSIS — I25118 Atherosclerotic heart disease of native coronary artery with other forms of angina pectoris: Secondary | ICD-10-CM | POA: Insufficient documentation

## 2018-01-22 LAB — BASIC METABOLIC PANEL
Anion gap: 10 (ref 5–15)
BUN: 16 mg/dL (ref 6–20)
CALCIUM: 8.9 mg/dL (ref 8.9–10.3)
CO2: 33 mmol/L — ABNORMAL HIGH (ref 22–32)
Chloride: 97 mmol/L — ABNORMAL LOW (ref 101–111)
Creatinine, Ser: 0.69 mg/dL (ref 0.61–1.24)
GFR calc Af Amer: >60 mL/min (ref >60)
GLUCOSE: 152 mg/dL — AB (ref 65–99)
Potassium: 3.9 mmol/L (ref 3.5–5.1)
SODIUM: 140 mmol/L (ref 135–145)

## 2018-01-22 LAB — HEPATIC FUNCTION PANEL
ALT: 26 U/L (ref 17–63)
AST: 23 U/L (ref 15–41)
Albumin: 3.8 g/dL (ref 3.5–5.0)
Alkaline Phosphatase: 121 U/L (ref 38–126)
BILIRUBIN DIRECT: 0.1 mg/dL (ref 0.1–0.5)
BILIRUBIN INDIRECT: 1.6 mg/dL — AB (ref 0.3–0.9)
Total Bilirubin: 1.7 mg/dL — ABNORMAL HIGH (ref 0.3–1.2)
Total Protein: 7.3 g/dL (ref 6.5–8.1)

## 2018-01-22 NOTE — Telephone Encounter (Signed)
Spoke with patient and made him aware of approval for Entresto. He was very appreciative for the call with update with no further questions at this time.

## 2018-01-22 NOTE — Telephone Encounter (Signed)
Entresto approved and patient is aware. Also called Shanda BumpsJessica with [employer] to make her aware and left message for her to call back if any questions.

## 2018-01-22 NOTE — Telephone Encounter (Signed)
Jason SkillernBRUCE Vazquez (Key: [id number]) Rx #: [id number] Entresto 24-26MG  tablets Effective from 01/22/2018 through 08/10/2038.

## 2018-01-25 ENCOUNTER — Other Ambulatory Visit: Payer: Self-pay | Admitting: Internal Medicine

## 2018-01-25 DIAGNOSIS — F5101 Primary insomnia: Secondary | ICD-10-CM

## 2018-02-19 ENCOUNTER — Other Ambulatory Visit: Payer: Self-pay | Admitting: Internal Medicine

## 2018-02-19 DIAGNOSIS — F5101 Primary insomnia: Secondary | ICD-10-CM

## 2018-03-21 MED ORDER — APIXABAN 2.5 MG PO TABS
2.50 | ORAL_TABLET | ORAL | Status: DC
Start: 2018-03-21 — End: 2018-03-21

## 2018-03-21 MED ORDER — GABAPENTIN 300 MG PO CAPS
600.00 | ORAL_CAPSULE | ORAL | Status: DC
Start: 2018-03-21 — End: 2018-03-21

## 2018-03-21 MED ORDER — CLONAZEPAM 0.5 MG PO TABS
0.50 | ORAL_TABLET | ORAL | Status: DC
Start: ? — End: 2018-03-21

## 2018-03-21 MED ORDER — LOPERAMIDE HCL 2 MG PO CAPS
4.00 | ORAL_CAPSULE | ORAL | Status: DC
Start: 2018-03-21 — End: 2018-03-21

## 2018-03-21 MED ORDER — TRAMADOL HCL 50 MG PO TABS
50.00 | ORAL_TABLET | ORAL | Status: DC
Start: ? — End: 2018-03-21

## 2018-03-21 MED ORDER — DIPHENOXYLATE-ATROPINE 2.5-0.025 MG PO TABS
2.00 | ORAL_TABLET | ORAL | Status: DC
Start: 2018-03-21 — End: 2018-03-21

## 2018-03-21 MED ORDER — FERROUS SULFATE 325 (65 FE) MG PO TABS
325.00 | ORAL_TABLET | ORAL | Status: DC
Start: 2018-03-22 — End: 2018-03-21

## 2018-03-21 MED ORDER — BUSPIRONE HCL 10 MG PO TABS
10.00 | ORAL_TABLET | ORAL | Status: DC
Start: 2018-03-21 — End: 2018-03-21

## 2018-03-21 MED ORDER — TAMSULOSIN HCL 0.4 MG PO CAPS
0.40 | ORAL_CAPSULE | ORAL | Status: DC
Start: 2018-03-21 — End: 2018-03-21

## 2018-03-21 MED ORDER — LACTATED RINGERS IV SOLN
100.00 | INTRAVENOUS | Status: DC
Start: ? — End: 2018-03-21

## 2018-03-21 MED ORDER — OPIUM 10 MG/ML (1%) PO TINC
5.00 | ORAL | Status: DC
Start: 2018-03-21 — End: 2018-03-21

## 2018-03-21 MED ORDER — METOPROLOL TARTRATE 25 MG PO TABS
12.50 | ORAL_TABLET | ORAL | Status: DC
Start: 2018-03-21 — End: 2018-03-21

## 2018-03-22 NOTE — Telephone Encounter (Signed)
Patient calling to check on status of message sent this morning Please advise

## 2018-03-24 ENCOUNTER — Telehealth: Payer: Self-pay | Admitting: Cardiovascular Disease

## 2018-03-24 NOTE — Telephone Encounter (Signed)
Call transferred directly to triage. The patient is calling to follow up on a MyChart message that was sent to Dr. Mariah MillingGollan on 03/22/18- he is upset at this point that it has taken so long for a response.   Per MyChart message:  Jason Vazquez,   [facility] 03/18/18 for very low blood pressure, dehydration and poor kidney function. I was given fluids intravenously until my pressure came up. While at the hospital the Doctors discontinued my Metoprolol and Entresto, I was released Sunday 03/21/18 and was told not to take said medicines until I contacted my Doctor. Please advise as to when to resume.   Thank Philipp OvensYou  Monique Jimerson 05-Sep-2055  [phone]   The patient reports that his blood pressure is now better at 100/50 HR- 80.   He confirms that he [facility] on Tuesday 8/6 with abdominal pain- BP at the time was 91/56  He went back Thursday 8/8 and was admitted with a BP of 80/40. He states that he has a colostomy due to an accident last year and can become dehydrated very easily.  He has been off entresto and metoprolol since he was [facility]. He is calling today to inquire what needs to be done with his medication.  I advised I will review with Dr. Mariah MillingGollan and call him back this afternoon.   The patient voices understanding.

## 2018-03-25 ENCOUNTER — Encounter: Payer: Self-pay | Admitting: Internal Medicine

## 2018-03-25 ENCOUNTER — Ambulatory Visit: Payer: BLUE CROSS/BLUE SHIELD | Admitting: Internal Medicine

## 2018-03-25 VITALS — BP 104/78 | HR 93 | Ht 67.0 in | Wt 178.0 lb

## 2018-03-25 DIAGNOSIS — N139 Obstructive and reflux uropathy, unspecified: Secondary | ICD-10-CM | POA: Diagnosis not present

## 2018-03-25 DIAGNOSIS — Z933 Colostomy status: Secondary | ICD-10-CM

## 2018-03-25 DIAGNOSIS — F5101 Primary insomnia: Secondary | ICD-10-CM

## 2018-03-25 DIAGNOSIS — I25118 Atherosclerotic heart disease of native coronary artery with other forms of angina pectoris: Secondary | ICD-10-CM | POA: Diagnosis not present

## 2018-03-25 DIAGNOSIS — I420 Dilated cardiomyopathy: Secondary | ICD-10-CM

## 2018-03-25 MED ORDER — TAMSULOSIN HCL 0.4 MG PO CAPS: 0.4000 mg | ORAL_CAPSULE | Freq: Every day | ORAL | 5 refills | Status: DC

## 2018-03-25 MED ORDER — PRAVASTATIN SODIUM 80 MG PO TABS: 80.0000 mg | ORAL_TABLET | Freq: Every day | ORAL | 5 refills | Status: DC

## 2018-03-25 MED ORDER — CLONAZEPAM 0.5 MG PO TABS: ORAL_TABLET | ORAL | 3 refills | Status: DC

## 2018-03-25 NOTE — Progress Notes (Signed)
Date:  03/25/2018   Name:  Jason SkillernBruce Vazquez   DOB:  Jan 03, 1956   MRN:  [id number]   Chief Complaint: Hypertension (Need refills on flomax, pravastatin, and Klonopin)  Hypertension  This is a chronic problem. Progression since onset: had low readings due to dehydration. Associated symptoms include anxiety. Pertinent negatives include no chest pain, headaches, palpitations or shortness of breath. Past treatments include beta blockers. Hypertensive end-organ damage includes CAD/MI and heart failure.  Hyperlipidemia  This is a chronic problem. The problem is controlled. Pertinent negatives include no chest pain or shortness of breath. Current antihyperlipidemic treatment includes statins. The current treatment provides significant improvement of lipids.  Benign Prostatic Hypertrophy  This is a chronic problem. Pertinent negatives include no chills. Past treatments include tamsulosin. The treatment provided moderate relief.  Anxiety  Presents for follow-up visit. Patient reports no chest pain, dizziness, palpitations or shortness of breath.    CAD/Heart failure - holding metoprolol and entresto due to dehydration and low BP.   Review of Systems  Constitutional: Negative for chills, fatigue and fever.  HENT: Negative for trouble swallowing.   Respiratory: Negative for cough, chest tightness, shortness of breath and wheezing.   Cardiovascular: Negative for chest pain and palpitations.  Neurological: Negative for dizziness and headaches.  Psychiatric/Behavioral: Negative for sleep disturbance.    Patient Active Problem List   Diagnosis Date Noted  . Dilated cardiomyopathy (HCC) 11/17/2017  . Atherosclerosis of native coronary artery of native heart with stable angina pectoris (HCC) 11/17/2017  . Status post split thickness skin graft 10/05/2017  . 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 Hyperglycemia 10/05/2017  . TBI (traumatic brain injury) (HCC) 04/24/2017  . Pelvic fracture (HCC)  04/14/2017  . Displaced fracture of right fibula 04/14/2017  . Colostomy status (HCC) 04/14/2017  . Cervical radiculitis 06/13/2014  . DDD (degenerative disc disease), cervical 06/13/2014  . Insomnia 12/30/2012  . Diverticulosis of large intestine 11/30/2012  . External hemorrhoids 11/30/2012  . Generalized anxiety disorder 11/30/2012  . Mixed hyperlipidemia 11/30/2012  . Obstructive and reflux uropathy 11/30/2012  . Essential (primary) hypertension 11/29/2012    Allergies  Allergen Reactions  . Fluoxetine Other (See Comments) and Rash    Night sweats - has also had other side effects with SSRIs (paxil/panic attacks) Night sweats - has also had other side effects with SSRIs (paxil/panic attacks)  . Paroxetine Hcl Anxiety    Past Surgical History:  Procedure Laterality Date  . ANKLE ARTHRODESIS Right 07/10/2017  . CLOSED REDUCTION TIBIAL FRACTURE Right 04/15/2017  . CYSTORRHAPHY  04/16/2017  . DEBRIDEMENT LEG Right 04/27/2017  . EXPLORATORY LAPAROTOMY  04/15/2017   with small bowel resection  . EXTERNAL FIXATION ANKLE FRACTURE Right 04/15/2017  . EXTERNAL FIXATION PELVIS  04/2017  . HEMORROIDECTOMY    . JEJUNOSTOMY  05/24/2017  . LAPAROSCOPIC SMALL BOWEL RESECTION  05/27/2017  . LEFT HEART CATH AND CORONARY ANGIOGRAPHY Left 11/20/2017   Procedure: LEFT HEART CATH AND CORONARY ANGIOGRAPHY;  Surgeon: Antonieta IbaGollan, Timothy J, MD;  Location: [facility] INVASIVE CV LAB;  Service: Cardiovascular;  Laterality: Left;  . ORIF PELVIC FRACTURE  04/2017  . ORIF TIBIA FRACTURE Right 05/12/2017  . SKIN GRAFT SPLIT THICKNESS TRUNK  07/31/2017  . TONSILLECTOMY     childhood    Social History   Tobacco Use  . Smoking status: Former Smoker    Packs/day: 1.50    Years: 10.00    Pack years: 15.00    Types: Cigarettes    Last attempt to quit:  2002    Years since quitting: 17.6  . Smokeless tobacco: Never Used  . Tobacco comment: off and on smoker  Substance Use Topics  . Alcohol use: No  .  Drug use: No     Medication list has been reviewed and updated.  Current Meds  Medication Sig  . B Complex Vitamins (VITAMIN-B COMPLEX) TABS Take by mouth.  . busPIRone (BUSPAR) 10 MG tablet TAKE 1 TABLET(10 MG) BY MOUTH TWICE DAILY  . Cholecalciferol (VITAMIN D3) 2000 units capsule Take 50,000 Units by mouth once a week.   . clonazePAM (KLONOPIN) 0.5 MG tablet TAKE 1 TO 1 AND 1/2 TABLETS BY MOUTH EVERY NIGHT AT BEDTIME  . diphenoxylate-atropine (LOMOTIL) 2.5-0.025 MG tablet Take 2 tablets by mouth 4 (four) times daily as needed for diarrhea or loose stools.   Marland Kitchen ELIQUIS 2.5 MG TABS tablet TAKE 1 TABLET(2.5 MG) BY MOUTH TWICE DAILY  . ferrous sulfate 325 (65 FE) MG tablet Take 325 mg by mouth 2 (two) times daily with a meal.  . gabapentin (NEURONTIN) 300 MG capsule 600 mg 3 (three) times daily.   Marland Kitchen loperamide (IMODIUM) 2 MG capsule Take 2 tablets 4 times daily  . Multiple Vitamin (MULTIVITAMIN) capsule Take by mouth.  . pravastatin (PRAVACHOL) 80 MG tablet Take 80 mg by mouth daily.   . tamsulosin (FLOMAX) 0.4 MG CAPS capsule Take 1 capsule (0.4 mg total) by mouth daily.  . traMADol (ULTRAM) 50 MG tablet Take 50 mg by mouth as needed.     PHQ 2/9 Scores 10/05/2017  PHQ - 2 Score 0  PHQ- 9 Score 0    Physical Exam  Constitutional: He is oriented to person, place, and time. He appears well-developed. No distress.  HENT:  Head: Normocephalic and atraumatic.  Neck: Normal range of motion. Neck supple.  Cardiovascular: Normal rate, regular rhythm and normal heart sounds.  Pulmonary/Chest: Effort normal and breath sounds normal. No respiratory distress.  Musculoskeletal: He exhibits no edema.  Lymphadenopathy:    He has no cervical adenopathy.  Neurological: He is alert and oriented to person, place, and time.  Skin: Skin is warm and dry.  Psychiatric: He has a normal mood and affect. His behavior is normal. Thought content normal.  Nursing note and vitals reviewed.   BP 104/78    Pulse 93   Ht 5\' 7"  (1.702 m)   Wt 178 lb (80.7 kg)   SpO2 99%   BMI 27.88 kg/m   Assessment and Plan: 1. Primary insomnia stable - clonazePAM (KLONOPIN) 0.5 MG tablet; TAKE 1 TO 1 AND 1/2 TABLETS BY MOUTH EVERY NIGHT AT BEDTIME  Dispense: 45 tablet; Refill: 3  2. Dilated cardiomyopathy (HCC) Followed by cardiology Holding entresto for now  3. Atherosclerosis of native coronary artery of native heart with stable angina pectoris (HCC) Stable without angina - pravastatin (PRAVACHOL) 80 MG tablet; Take 1 tablet (80 mg total) by mouth daily.  Dispense: 30 tablet; Refill: 5  4. Obstructive and reflux uropathy - tamsulosin (FLOMAX) 0.4 MG CAPS capsule; Take 1 capsule (0.4 mg total) by mouth daily.  Dispense: 30 capsule; Refill: 5  5. Colostomy status (HCC) Still working toward re-anastamosis Aiming for October   Meds ordered this encounter  Medications  . pravastatin (PRAVACHOL) 80 MG tablet    Sig: Take 1 tablet (80 mg total) by mouth daily.    Dispense:  30 tablet    Refill:  5  . tamsulosin (FLOMAX) 0.4 MG CAPS capsule    Sig:  Take 1 capsule (0.4 mg total) by mouth daily.    Dispense:  30 capsule    Refill:  5  . clonazePAM (KLONOPIN) 0.5 MG tablet    Sig: TAKE 1 TO 1 AND 1/2 TABLETS BY MOUTH EVERY NIGHT AT BEDTIME    Dispense:  45 tablet    Refill:  3    Partially dictated using Animal nutritionistDragon software. Any errors are unintentional.  Bari EdwardLaura Takeira Yanes, MD [facility]  03/25/2018

## 2018-03-25 NOTE — Telephone Encounter (Signed)
Late entry: I spoke with Dr. Mariah MillingGollan last night regarding the patient's blood pressures, metoprolol, & entresto.  Confirmed with the patient he is taking metoprolol tartrate 25 mg BID (last office note from Dr. Mariah MillingGollan mentioned he was [facility]).  Per Dr. Mariah MillingGollan:  1) Once the patient has a SBP >/= 115 for a few days, then restart metoprolol tartrate 25 mg BID  2) Record BP readings for 5 days after starting metoprolol, then call the office with those readings  3) After that we will determine whether or not he will be able to resume entresto or if an alternate drug will need to be started.   I called the patient back last night and advised him of Dr. Windell HummingbirdGollan's recommendations.  He voiced understanding and was agreeable with the above plan.

## 2018-04-07 NOTE — Addendum Note (Signed)
Addended by: Sandre KittyGILLEY, Tida Saner F on: 04/07/2018 12:15 PM   Modules accepted: Level of Service, SmartSet

## 2018-04-07 NOTE — Telephone Encounter (Signed)
This encounter was created in error - please disregard.

## 2018-05-03 ENCOUNTER — Telehealth: Payer: Self-pay | Admitting: Cardiovascular Disease

## 2018-05-03 NOTE — Telephone Encounter (Signed)
[facility] calling - states they need his clearance information faxed over Please fax to [phone] - needed for anesthesia purposes

## 2018-05-03 NOTE — Telephone Encounter (Signed)
Pt states he needs his clearance from April 2019 sent to his surgeon. Dr. Erlene QuanNicole Chaunont. Please call to discuss

## 2018-05-04 NOTE — Telephone Encounter (Signed)
Spoke with patient and he just wanted to make sure that we sent the last office visit note to them. Advised that I would get that sent over and if he should have any further questions to give us a call back. He was appreciative for the call with no further questions at this time.

## 2018-05-05 NOTE — Telephone Encounter (Signed)
° °  [facility] Pre-operative Risk Assessment    Request for surgical clearance:  1. What type of surgery is being performed? EC fistula take down and possible colostomy take down   2. When is this surgery scheduled? 05-12-18  3. What type of clearance is required (medical clearance vs. Pharmacy clearance to hold med vs. Both)? Both   4. Are there any medications that need to be held prior to surgery and how long? eliquis x 1 days   5. Practice name and name of physician performing surgery? [facility]  Dr Sharol Roussel   6. What is your office phone number [phone]    7.   What is your office fax number [phone]  8.   Anesthesia type (None, local, MAC, general) ? General    Jason Vazquez 05/05/2018, 9:33 AM  _________________________________________________________________   (provider comments below)

## 2018-05-05 NOTE — Telephone Encounter (Signed)
Dr. Mariah Milling,  The patient was last seen by you 12/07/2017. Would you want him to come in prior to clearing him for surgery?  Thank you!

## 2018-05-06 NOTE — Telephone Encounter (Signed)
Can we get an update on his condition Likely does not need further medication changes He has had recent catheterization, further cardiac work-up needed before surgery Would likely be moderate but acceptable risk

## 2018-05-07 NOTE — Telephone Encounter (Signed)
I called and spoke with the patient.  He states that he is stable from a cardiac perspective.  He is advised I will fax his clearance over this afternoon.  Will address eliquis with Dr. Mariah Milling prior to sending.

## 2018-05-07 NOTE — Telephone Encounter (Signed)
Copy of phone encounter faxed to Dr. Elwin Mocha at [phone]. Confirmation received.

## 2018-05-07 NOTE — Telephone Encounter (Signed)
Per Dr. Mariah Milling, the patient is at moderate, but acceptable risk for surgery.  He may hold eliquis x 2 days if needed prior to his procedure.

## 2018-05-11 NOTE — Telephone Encounter (Signed)
Spoke with Dr. Elwin Mocha and she was just wanting to clarify patients cardiac clearance for upcoming surgery. Dr. Mariah Milling here as well and he will update documentation to reflect his clearance. She verbalized understanding of his clearance with no further questions at this time.

## 2018-05-11 NOTE — Telephone Encounter (Signed)
typo below Film/video editor) No further cardiac work-up needed Acceptable risk for procedure/surgery

## 2018-05-12 ENCOUNTER — Other Ambulatory Visit: Payer: BLUE CROSS/BLUE SHIELD

## 2018-05-12 ENCOUNTER — Ambulatory Visit: Payer: BLUE CROSS/BLUE SHIELD | Admitting: Oncology

## 2018-05-12 MED ORDER — LACTATED RINGERS IV SOLN
10.00 | INTRAVENOUS | Status: DC
Start: ? — End: 2018-05-12

## 2018-05-12 NOTE — Telephone Encounter (Signed)
Note routed to provider through Epic.

## 2018-05-18 ENCOUNTER — Other Ambulatory Visit: Payer: Self-pay | Admitting: Internal Medicine

## 2018-06-07 ENCOUNTER — Ambulatory Visit: Payer: BLUE CROSS/BLUE SHIELD | Admitting: Cardiovascular Disease

## 2018-06-07 HISTORY — PX: OTHER SURGICAL HISTORY: SHX169

## 2018-06-16 MED ORDER — METHOCARBAMOL 500 MG PO TABS
500.00 | ORAL_TABLET | ORAL | Status: DC
Start: ? — End: 2018-06-16

## 2018-06-16 MED ORDER — BUSPIRONE HCL 10 MG PO TABS
10.00 | ORAL_TABLET | ORAL | Status: DC
Start: 2018-06-16 — End: 2018-06-16

## 2018-06-16 MED ORDER — CLONAZEPAM 0.5 MG PO TABS
.50 | ORAL_TABLET | ORAL | Status: DC
Start: ? — End: 2018-06-16

## 2018-06-16 MED ORDER — PROMETHAZINE HCL 12.5 MG PO TABS
12.50 | ORAL_TABLET | ORAL | Status: DC
Start: ? — End: 2018-06-16

## 2018-06-16 MED ORDER — ONDANSETRON 4 MG PO TBDP
4.00 | ORAL_TABLET | ORAL | Status: DC
Start: ? — End: 2018-06-16

## 2018-06-16 MED ORDER — OPIUM 10 MG/ML (1%) PO TINC
15.00 | ORAL | Status: DC
Start: 2018-06-16 — End: 2018-06-16

## 2018-06-16 MED ORDER — NALOXONE HCL 0.4 MG/ML IJ SOLN
0.40 | INTRAMUSCULAR | Status: DC
Start: ? — End: 2018-06-16

## 2018-06-16 MED ORDER — METRONIDAZOLE 500 MG PO TABS
500.00 | ORAL_TABLET | ORAL | Status: DC
Start: 2018-06-16 — End: 2018-06-16

## 2018-06-16 MED ORDER — DIPHENOXYLATE-ATROPINE 2.5-0.025 MG PO TABS
2.00 | ORAL_TABLET | ORAL | Status: DC
Start: 2018-06-16 — End: 2018-06-16

## 2018-06-16 MED ORDER — ACETAMINOPHEN 500 MG PO TABS
1000.00 | ORAL_TABLET | ORAL | Status: DC
Start: 2018-06-16 — End: 2018-06-16

## 2018-06-16 MED ORDER — TAMSULOSIN HCL 0.4 MG PO CAPS
0.40 | ORAL_CAPSULE | ORAL | Status: DC
Start: 2018-06-16 — End: 2018-06-16

## 2018-06-16 MED ORDER — LOPERAMIDE HCL 2 MG PO CAPS
4.00 | ORAL_CAPSULE | ORAL | Status: DC
Start: 2018-06-16 — End: 2018-06-16

## 2018-06-16 MED ORDER — PRAVASTATIN SODIUM 40 MG PO TABS
80.00 | ORAL_TABLET | ORAL | Status: DC
Start: 2018-06-16 — End: 2018-06-16

## 2018-06-16 MED ORDER — LEVOFLOXACIN 750 MG PO TABS
750.00 | ORAL_TABLET | ORAL | Status: DC
Start: 2018-06-17 — End: 2018-06-16

## 2018-06-16 MED ORDER — LIDOCAINE 5 % EX PTCH
2.00 | MEDICATED_PATCH | CUTANEOUS | Status: DC
Start: 2018-06-17 — End: 2018-06-16

## 2018-06-16 MED ORDER — METOPROLOL TARTRATE 25 MG PO TABS
25.00 | ORAL_TABLET | ORAL | Status: DC
Start: 2018-06-16 — End: 2018-06-16

## 2018-06-16 MED ORDER — HYDROMORPHONE HCL 4 MG PO TABS
4.00 | ORAL_TABLET | ORAL | Status: DC
Start: ? — End: 2018-06-16

## 2018-06-16 MED ORDER — GABAPENTIN 300 MG PO CAPS
600.00 | ORAL_CAPSULE | ORAL | Status: DC
Start: 2018-06-16 — End: 2018-06-16

## 2018-06-16 MED ORDER — HEPARIN SODIUM (PORCINE) 5000 UNIT/ML IJ SOLN
5000.00 | INTRAMUSCULAR | Status: DC
Start: 2018-06-16 — End: 2018-06-16

## 2018-06-17 ENCOUNTER — Other Ambulatory Visit: Payer: Self-pay | Admitting: Internal Medicine

## 2018-06-17 ENCOUNTER — Encounter: Payer: Self-pay | Admitting: Internal Medicine
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
## 2018-07-06 ENCOUNTER — Encounter: Payer: Self-pay | Admitting: Cardiovascular Disease

## 2018-07-06 ENCOUNTER — Ambulatory Visit (INDEPENDENT_AMBULATORY_CARE_PROVIDER_SITE_OTHER): Payer: BLUE CROSS/BLUE SHIELD | Admitting: Cardiovascular Disease

## 2018-07-06 VITALS — BP 126/66 | HR 53 | Ht 67.0 in | Wt 177.0 lb

## 2018-07-06 DIAGNOSIS — I25118 Atherosclerotic heart disease of native coronary artery with other forms of angina pectoris: Secondary | ICD-10-CM

## 2018-07-06 DIAGNOSIS — I420 Dilated cardiomyopathy: Secondary | ICD-10-CM | POA: Diagnosis not present

## 2018-07-06 DIAGNOSIS — E782 Mixed hyperlipidemia: Secondary | ICD-10-CM

## 2018-07-06 NOTE — Patient Instructions (Addendum)
  Medication Instructions:  No changes  If you need a refill on your cardiac medications before your next appointment, please call your pharmacy.    Lab work: No new labs needed   If you have labs (blood work) drawn today and your tests are completely normal, you will receive your results only by: Marland Kitchen. MyChart Message (if you have MyChart) OR . A paper copy in the mail If you have any lab test that is abnormal or we need to change your treatment, we will call you to review the results.   Testing/Procedures:  We will order an echocardiogram in 4 to 5 months for cardiomyopathy   Follow-Up: At [facility], you and your health needs are our priority.  As part of our continuing mission to provide you with exceptional heart care, we have created designated Provider Care Teams.  These Care Teams include your primary Cardiologist (physician) and Advanced Practice Providers (APPs -  Physician Assistants and Nurse Practitioners) who all work together to provide you with the care you need, when you need it.  . You will need a follow up appointment in 6 months .   Please call our office 2 months in advance to schedule this appointment.    . Providers on your designated Care Team:   . Nicolasa Duckinghristopher Berge, NP . Eula Listenyan Dunn, PA-C . Marisue IvanJacquelyn Visser, PA-C  Any Other Special Instructions Will Be Listed Below (If Applicable).  For educational health videos Log in to : [email or web address] Or : [email or web address], password : [email or web address]

## 2018-07-15 ENCOUNTER — Other Ambulatory Visit: Payer: Self-pay | Admitting: Internal Medicine

## 2018-07-15 DIAGNOSIS — F5101 Primary insomnia: Secondary | ICD-10-CM

## 2018-07-16 ENCOUNTER — Telehealth: Payer: Self-pay

## 2018-07-16 NOTE — Telephone Encounter (Signed)
Called patient. Spoke with him and he stated his insurance is [facility] and he will no longer to be able to see us here at [facility].

## 2018-07-16 NOTE — Telephone Encounter (Signed)
-----   Message from Reubin MilanLaura H Berglund, MD sent at 07/16/2018 12:00 PM EST ----- [facility].  If he no longer coming here, I need to be made aware. thanks

## 2018-07-20 ENCOUNTER — Telehealth: Payer: Self-pay

## 2018-07-20 ENCOUNTER — Other Ambulatory Visit: Payer: Self-pay | Admitting: Internal Medicine

## 2018-07-20 NOTE — Telephone Encounter (Signed)
Patient called stating he is requesting a refill on Gabapentin 600 mg TID. Stated he has been taking this but got this from orthopedics and only seen them once. Was told he did not need to follow up with them. He said this help with his nerve pain in his legs.   Please Advise.

## 2018-07-20 NOTE — Telephone Encounter (Signed)
Dr Judithann GravesBerglund sent in patient refill. Patient informed he will need to find new PCP for next year to cont refills.

## 2018-07-20 NOTE — Telephone Encounter (Signed)
He can still call them for a refill.  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

## 2018-07-28 ENCOUNTER — Telehealth: Payer: Self-pay | Admitting: Cardiovascular Disease

## 2018-07-28 NOTE — Telephone Encounter (Signed)
Patient calling - we have not received official medical clearance form from [facility] Patient will be having surgery on January 9th - wants clearance completed ASAP - has had issues before with getting clearance      [facility] Pre-operative Risk Assessment    Request for surgical clearance:  1. What type of surgery is being performed? Skin graff    2. When is this surgery scheduled? 08/19/18  3. What type of clearance is required (medical clearance vs. Pharmacy clearance to hold med vs. Both? Both   4. Are there any medications that need to be held prior to surgery and how long? Eliquis medication   5. Practice name and name of physician performing surgery? [facility] - Dr. Nat Math  6. What is your office phone number  [phone]   7.   What is your office fax number [phone]  8.   Anesthesia type (None, local, MAC, general) ? Patient says there will be but does not know what kind    Ace Gins 07/28/2018, 2:41 PM  _________________________________________________________________   (provider comments below)

## 2018-07-28 NOTE — Telephone Encounter (Signed)
I called and spoke with Dr. Justin Mendgunleyie's nurse, Rosie Fateachel Hellar. She clarified that they would like a cardiac clearance for the patient, but more importantly, an ok to hold his blood thinner prior to his surgery and when to resume. The timing of when to stop prior is at the descretion of Dr. Mariah MillingGollan per Fleet Contrasachel.  The patient is having an abdominal closure with skin grafting under general anesthesia.   Per Fleet Contrasachel, the patient was unclear as to why he his on eliquis. Per Dr. Windell HummingbirdGollan's last note on  07/06/18: Was discharged On eliquis 2.5 BID Presumably for DVT prophylaxis  I advised Fleet ContrasRachel that I would need to review with Dr. Mariah MillingGollan and we will fax recommendations back.  Confirmed fax # as stated in initial clearance message. Per Fleet Contrasachel- put attn: Fleet Contrasachel on the clearance.  To Dr. Mariah MillingGollan to review.

## 2018-07-31 NOTE — Telephone Encounter (Signed)
We can stop the eliquis now that he has recovered , should not need DVT prophylaxis would start asa 81 daily

## 2018-08-02 NOTE — Telephone Encounter (Signed)
Message routed to Jason Fateachel Hellar, RN with Dr. Jodene Namgunleye's office.   Call to pt to discuss POC. Pt reports that he is unable to take aspirin at this time d/t GI upset. He has an ileostomy and has bleeding at times and believes that aspirin would exacerbate sx.  In the past he has used EC asa.   Routing to Dr. Mariah MillingGollan for further advice on asa use.

## 2018-08-06 MED ORDER — ASPIRIN EC 81 MG PO TBEC: 81.0000 mg | DELAYED_RELEASE_TABLET | Freq: Every day | ORAL | 3 refills | Status: AC

## 2018-08-06 NOTE — Telephone Encounter (Signed)
Spoke with patient to follow up and let him know that information was sent over to surgeons office as requested. He did want to know if the enteric coated aspirin would absorb given his gut issues. Recommended that he check with his GI physician and/or surgeon about this. Will update his medication list and instructed patient to call back if any further questions.

## 2018-08-06 NOTE — Telephone Encounter (Signed)
Left a message for the patient to call back.  

## 2018-08-06 NOTE — Telephone Encounter (Signed)
Would use enteric-coated aspirin 81 mg daily for now If he is concerned about absorption following the surgery we could always change him to Plavix 75 mg daily with no aspirin He does need antiplatelet treatment given his underlying coronary disease Either aspirin enteric-coated 81 daily or Plavix 75 mg daily would do

## 2018-09-08 ENCOUNTER — Other Ambulatory Visit: Payer: Self-pay | Admitting: Internal Medicine

## 2018-09-08 DIAGNOSIS — I25118 Atherosclerotic heart disease of native coronary artery with other forms of angina pectoris: Secondary | ICD-10-CM

## 2018-09-08 DIAGNOSIS — N139 Obstructive and reflux uropathy, unspecified: Secondary | ICD-10-CM

## 2018-09-27 ENCOUNTER — Ambulatory Visit: Payer: BLUE CROSS/BLUE SHIELD | Admitting: Internal Medicine

## 2018-10-11 ENCOUNTER — Other Ambulatory Visit: Payer: Self-pay | Admitting: Internal Medicine

## 2018-10-11 DIAGNOSIS — F5101 Primary insomnia: Secondary | ICD-10-CM

## 2018-11-04 ENCOUNTER — Other Ambulatory Visit: Payer: BLUE CROSS/BLUE SHIELD

## 2018-12-23 ENCOUNTER — Other Ambulatory Visit: Payer: Self-pay | Admitting: Internal Medicine

## 2018-12-23 DIAGNOSIS — F5101 Primary insomnia: Secondary | ICD-10-CM
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
## 2020-09-21 ENCOUNTER — Telehealth: Payer: Self-pay | Admitting: Cardiovascular Disease

## 2020-09-21 NOTE — Telephone Encounter (Signed)
3 attempts to schedule fu appt from recall list.   Deleting recall.
